# Patient Record
Sex: Male | Born: 1990 | Race: White | Hispanic: No | Marital: Single | Smoking: Current every day smoker
Health system: Southern US, Community
[De-identification: ages and names within clinical notes are randomized; demographics above are authoritative.]

## PROBLEM LIST (undated history)

## (undated) DIAGNOSIS — F32A Depression, unspecified: Secondary | ICD-10-CM

## (undated) DIAGNOSIS — F909 Attention-deficit hyperactivity disorder, unspecified type: Secondary | ICD-10-CM

## (undated) DIAGNOSIS — S82899A Other fracture of unspecified lower leg, initial encounter for closed fracture: Secondary | ICD-10-CM

## (undated) DIAGNOSIS — S129XXA Fracture of neck, unspecified, initial encounter: Secondary | ICD-10-CM

## (undated) DIAGNOSIS — F419 Anxiety disorder, unspecified: Secondary | ICD-10-CM

## (undated) DIAGNOSIS — F1210 Cannabis abuse, uncomplicated: Secondary | ICD-10-CM

## (undated) DIAGNOSIS — S0292XA Unspecified fracture of facial bones, initial encounter for closed fracture: Secondary | ICD-10-CM

## (undated) HISTORY — PX: SKULL FRACTURE ELEVATION: SHX781

## (undated) HISTORY — PX: BACK SURGERY: SHX140

---
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
## 2011-02-26 ENCOUNTER — Ambulatory Visit: Payer: Self-pay | Admitting: Family Medicine

## 2012-05-30 DIAGNOSIS — S0292XA Unspecified fracture of facial bones, initial encounter for closed fracture: Secondary | ICD-10-CM | POA: Insufficient documentation

## 2012-06-30 DIAGNOSIS — S129XXA Fracture of neck, unspecified, initial encounter: Secondary | ICD-10-CM | POA: Insufficient documentation

## 2013-01-30 ENCOUNTER — Emergency Department: Payer: Self-pay | Admitting: Emergency Medicine

## 2013-01-30 LAB — CBC WITH DIFFERENTIAL/PLATELET
Basophil %: 0.4 %
Lymphocyte %: 27.2 %
MCH: 31.2 pg (ref 26.0–34.0)
MCHC: 34.2 g/dL (ref 32.0–36.0)
MCV: 91 fL (ref 80–100)
Monocyte #: 1 x10 3/mm (ref 0.2–1.0)
Monocyte %: 9.5 %
Neutrophil %: 60.7 %
RBC: 4.68 10*6/uL (ref 4.40–5.90)

## 2013-01-30 LAB — COMPREHENSIVE METABOLIC PANEL
Albumin: 4.1 g/dL (ref 3.4–5.0)
Alkaline Phosphatase: 93 U/L (ref 50–136)
Anion Gap: 2 — ABNORMAL LOW (ref 7–16)
BUN: 8 mg/dL (ref 7–18)
Bilirubin,Total: 0.3 mg/dL (ref 0.2–1.0)
Calcium, Total: 9.5 mg/dL (ref 8.5–10.1)
Chloride: 105 mmol/L (ref 98–107)
Creatinine: 0.99 mg/dL (ref 0.60–1.30)
Glucose: 152 mg/dL — ABNORMAL HIGH (ref 65–99)
Osmolality: 279 (ref 275–301)
Potassium: 3.9 mmol/L (ref 3.5–5.1)
SGOT(AST): 14 U/L — ABNORMAL LOW (ref 15–37)
Sodium: 139 mmol/L (ref 136–145)
Total Protein: 7.8 g/dL (ref 6.4–8.2)

## 2013-01-30 LAB — URINALYSIS, COMPLETE
Bilirubin,UR: NEGATIVE
Blood: NEGATIVE
Glucose,UR: NEGATIVE mg/dL (ref 0–75)
Ph: 5 (ref 4.5–8.0)
Protein: 30
RBC,UR: 2 /HPF (ref 0–5)
Squamous Epithelial: 1
WBC UR: 2 /HPF (ref 0–5)

## 2013-02-23 ENCOUNTER — Emergency Department: Payer: Self-pay | Admitting: Emergency Medicine

## 2013-02-28 ENCOUNTER — Ambulatory Visit: Payer: Self-pay | Admitting: Pain Medicine

## 2013-03-10 ENCOUNTER — Emergency Department: Payer: Self-pay | Admitting: Emergency Medicine

## 2013-03-11 ENCOUNTER — Emergency Department: Payer: Self-pay | Admitting: Emergency Medicine

## 2013-03-11 LAB — COMPREHENSIVE METABOLIC PANEL
Albumin: 4.3 g/dL (ref 3.4–5.0)
Alkaline Phosphatase: 100 U/L (ref 50–136)
BUN: 7 mg/dL (ref 7–18)
Bilirubin,Total: 0.3 mg/dL (ref 0.2–1.0)
Calcium, Total: 9.5 mg/dL (ref 8.5–10.1)
EGFR (African American): >60
EGFR (Non-African Amer.): >60
Osmolality: 277 (ref 275–301)
Potassium: 4 mmol/L (ref 3.5–5.1)
SGOT(AST): 19 U/L (ref 15–37)
SGPT (ALT): 21 U/L (ref 12–78)
Sodium: 140 mmol/L (ref 136–145)
Total Protein: 7.9 g/dL (ref 6.4–8.2)

## 2013-03-11 LAB — CBC
MCH: 30.9 pg (ref 26.0–34.0)
MCV: 90 fL (ref 80–100)
Platelet: 340 10*3/uL (ref 150–440)
RDW: 13 % (ref 11.5–14.5)

## 2013-03-11 LAB — URINALYSIS, COMPLETE
RBC,UR: 3 /HPF (ref 0–5)
Squamous Epithelial: 1

## 2013-08-10 ENCOUNTER — Emergency Department: Payer: Self-pay | Admitting: Internal Medicine

## 2015-06-03 ENCOUNTER — Other Ambulatory Visit: Payer: Self-pay | Admitting: Thoracic Surgery

## 2015-06-03 ENCOUNTER — Ambulatory Visit
Admission: RE | Admit: 2015-06-03 | Discharge: 2015-06-03 | Disposition: A | Discharge status: Home / Self Care | Payer: Disability Insurance | Source: Ambulatory Visit | Attending: Thoracic Surgery | Admitting: Thoracic Surgery

## 2015-06-03 DIAGNOSIS — M549 Dorsalgia, unspecified: Secondary | ICD-10-CM | POA: Diagnosis present

## 2015-06-03 DIAGNOSIS — M542 Cervicalgia: Secondary | ICD-10-CM | POA: Insufficient documentation

## 2015-06-03 DIAGNOSIS — S22089A Unspecified fracture of T11-T12 vertebra, initial encounter for closed fracture: Secondary | ICD-10-CM | POA: Insufficient documentation

## 2015-06-03 DIAGNOSIS — M47814 Spondylosis without myelopathy or radiculopathy, thoracic region: Secondary | ICD-10-CM | POA: Insufficient documentation

## 2016-11-03 ENCOUNTER — Emergency Department: Payer: Self-pay

## 2016-11-03 ENCOUNTER — Encounter: Payer: Self-pay | Admitting: Medical Oncology

## 2016-11-03 ENCOUNTER — Emergency Department
Admission: EM | Admit: 2016-11-03 | Discharge: 2016-11-03 | Disposition: A | Discharge status: Home / Self Care | Payer: Self-pay | Attending: Emergency Medicine | Admitting: Emergency Medicine

## 2016-11-03 DIAGNOSIS — F17200 Nicotine dependence, unspecified, uncomplicated: Secondary | ICD-10-CM | POA: Insufficient documentation

## 2016-11-03 DIAGNOSIS — K047 Periapical abscess without sinus: Secondary | ICD-10-CM | POA: Insufficient documentation

## 2016-11-03 MED ORDER — CLINDAMYCIN HCL 300 MG PO CAPS: 300.0000 mg | ORAL_CAPSULE | Freq: Four times a day (QID) | ORAL | 0 refills | Status: AC

## 2016-11-03 MED ORDER — HYDROCODONE-ACETAMINOPHEN 5-325 MG PO TABS: 1.0000 | ORAL_TABLET | Freq: Four times a day (QID) | ORAL | 0 refills | Status: DC | PRN
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
## 2016-11-03 NOTE — Discharge Instructions (Signed)
OPTIONS FOR DENTAL FOLLOW UP CARE ° °[place] [facility] and Human Services - Local Safety Net Dental Clinics °http://www.ncdhhs.gov/dph/oralhealth/services/safetynetclinics.htm °  °[facility] (336-562-3123) ° °[facility] [place] (919-933-9087) ° °[facility] ([phone] ext [phone]) ° °[facility] ([phone]) ° °[facility] ([phone]) °This clinic caters to the indigent population and is on a lottery system. °Location: °[facility], [address], [place] °Clinic Hours: °Wednesdays from 6pm - 9pm, patients seen by a lottery system. °For dates, call or go to www.med.[facility].edu/[facility]/patients/Dental-[facility] °Services: °Cleanings, fillings and simple extractions. °Payment Options: °DENTAL WORK IS FREE OF CHARGE. Bring proof of income or support. °Best way to get seen: °Arrive at 5:15 pm - this is a lottery, NOT first come/first serve, so arriving earlier will not increase your chances of being seen. °  °  °[facility] °919-537-3737 °Select option 1 for emergencies °  °Location: °[facility], [address], [place] °Clinic Hours: °No walk-ins accepted - call the day before to schedule an appointment. °Check in times are 9:30 am and 1:30 pm. °Services: °Simple extractions, temporary fillings, pulpectomy/pulp debridement, uncomplicated abscess drainage. °Payment Options: °PAYMENT IS DUE AT THE TIME OF SERVICE.  Fee is usually $100-200, additional surgical procedures (e.g. abscess drainage) may be extra. °Cash, checks, Visa/MasterCard accepted.  Can file Medicaid if patient is covered for dental - patient should call case worker to check. °No discount for [facility] patients. °Best way to get seen: °MUST call the day before and get onto the schedule. Can usually be seen the next 1-2 days. No walk-ins accepted. °  °  °[facility] °[phone] °   °Location: °[facility], [address], [place] °Clinic Hours: °M, W, Th, F 8am or 1:30pm, Tues 9a or 1:30 - first come/first served. °Services: °Simple extractions, temporary fillings, uncomplicated abscess drainage.  You do not need to be an [place] resident. °Payment Options: °PAYMENT IS DUE AT THE TIME OF SERVICE. °Dental insurance, otherwise sliding scale - bring proof of income or support. °Depending on income and treatment needed, cost is usually $50-200. °Best way to get seen: °Arrive early as it is first come/first served. °  °  °[facility] °[phone] °  °Location: °[address] °Clinic Hours: °Mon-Thu 8a-5p °Services: °Most basic dental services including extractions and fillings. °Payment Options: °PAYMENT IS DUE AT THE TIME OF SERVICE. °Sliding scale, up to 50% off - bring proof if income or support. °Medicaid with dental option accepted. °Best way to get seen: °Call to schedule an appointment, can usually be seen within 2 weeks OR they will try to see walk-ins - show up at 8a or 2p (you may have to wait). °  °  °[facility] °919-245-2435 °[place] RESIDENTS ONLY °  °Location: °[facility], [address], [place], [place] [address] °Clinic Hours: By appointment only. °Monday - Thursday 8am-5pm, Friday 8am-12pm °Services: Cleanings, fillings, extractions. °Payment Options: °PAYMENT IS DUE AT THE TIME OF SERVICE. °Cash, Visa or MasterCard. Sliding scale - $30 minimum per service. °Best way to get seen: °Come in to office, complete packet and make an appointment - need proof of income °or support monies for each household member and proof of [place] residence. °Usually takes about a month to get in. °  °  °[facility] °[phone] °  °Location: °[address]   [place] °Clinic Hours: Walk-in Urgent Care Dental Services are offered Monday-Friday  mornings only. °The numbers of emergencies accepted daily is limited to the number of °providers available. °Maximum 15 - Mondays, Wednesdays & Thursdays °Maximum 10 - Tuesdays & Fridays °Services: °You do not need to be a  [place] resident to be seen for a dental emergency. °Emergencies are defined as pain, swelling, abnormal bleeding, or dental trauma. Walkins will receive x-rays if needed. °NOTE: Dental cleaning is not an emergency. °Payment Options: °PAYMENT IS DUE AT THE TIME OF SERVICE. °Minimum co-pay is $40.00 for uninsured patients. °Minimum co-pay is $3.00 for Medicaid with dental coverage. °Dental Insurance is accepted and must be presented at time of visit. °Medicare does not cover dental. °Forms of payment: Cash, credit card, checks. °Best way to get seen: °If not previously registered with the clinic, walk-in dental registration begins at 7:15 am and is on a first come/first serve basis. °If previously registered with the clinic, call to make an appointment. °  °  °[facility] °919-776-4359 °[place] RESIDENTS ONLY °  °Location: °[address], [place], [place] °Clinic Hours: °Mon-Thu 10a-2p °Services: Extractions only! °Payment Options: °FREE (donations accepted) - bring proof of income or support °Best way to get seen: °Call and schedule an appointment OR come at 8am on the 1st Monday of every month (except for holidays) when it is first come/first served. °  °  °[facility] °[phone] °  °Location: °[address], [place] °Clinic Hours: °Friday mornings °Services, Payment Options, Best way to get seen: °Call for info °

## 2016-11-03 NOTE — ED Triage Notes (Signed)
Lower dental pain.

## 2016-11-03 NOTE — ED Notes (Signed)
See triage note  States he developed pain to lower gumline several days ago  No fever or trauma

## 2017-02-21 ENCOUNTER — Encounter: Payer: Self-pay | Admitting: Emergency Medicine

## 2017-02-21 ENCOUNTER — Emergency Department
Admission: EM | Admit: 2017-02-21 | Discharge: 2017-02-21 | Disposition: A | Discharge status: Home / Self Care | Payer: Disability Insurance | Attending: Emergency Medicine | Admitting: Emergency Medicine

## 2017-02-21 DIAGNOSIS — F17210 Nicotine dependence, cigarettes, uncomplicated: Secondary | ICD-10-CM | POA: Insufficient documentation

## 2017-02-21 DIAGNOSIS — Y998 Other external cause status: Secondary | ICD-10-CM | POA: Insufficient documentation

## 2017-02-21 DIAGNOSIS — B86 Scabies: Secondary | ICD-10-CM | POA: Insufficient documentation

## 2017-02-21 DIAGNOSIS — W57XXXA Bitten or stung by nonvenomous insect and other nonvenomous arthropods, initial encounter: Secondary | ICD-10-CM | POA: Insufficient documentation

## 2017-02-21 DIAGNOSIS — S40862A Insect bite (nonvenomous) of left upper arm, initial encounter: Secondary | ICD-10-CM | POA: Insufficient documentation

## 2017-02-21 DIAGNOSIS — Y939 Activity, unspecified: Secondary | ICD-10-CM | POA: Insufficient documentation

## 2017-02-21 DIAGNOSIS — Y929 Unspecified place or not applicable: Secondary | ICD-10-CM | POA: Insufficient documentation

## 2017-02-21 HISTORY — DX: Attention-deficit hyperactivity disorder, unspecified type: F90.9

## 2017-02-21 MED ORDER — PERMETHRIN 1 % EX LOTN: TOPICAL_LOTION | CUTANEOUS | 1 refills | Status: AC

## 2017-02-21 NOTE — ED Provider Notes (Signed)
[facility] Emergency Department Provider Note  ____________________________________________  Time seen: Approximately 8:05 PM  I have reviewed the triage vital signs and the nursing notes.   HISTORY  Chief Complaint Insect Bite    HPI PROCTOR CARRIKER is a 26 y.o. male presenting to the emergency department with diffuse rash of the upper extremities with signs of excoriation. Burroughs visualized along bilateral forearms. Patient states that he has been recently staying in a trailer. His girlfriend has similar symptoms. Patient has been applying calamine lotion, which minimally relieved his symptoms. He denies dysphagia, chest tightness, chest pain, nausea, vomiting and abdominal pain.   Past Medical History:  Diagnosis Date  . ADHD     There are no active problems to display for this patient.   Past Surgical History:  Procedure Laterality Date  . BACK SURGERY    . SKULL FRACTURE ELEVATION      Prior to Admission medications   Medication Sig Start Date End Date Taking? Authorizing Provider  permethrin (PERMETHRIN LICE TREATMENT) 1 % lotion Apply to affected area once 02/21/17 02/20/18  Orvil Feil, PA-C    Allergies Penicillins  History reviewed. No pertinent family history.  Social History Social History  Substance Use Topics  . Smoking status: Current Every Day Smoker    Packs/day: 1.00    Types: Cigarettes  . Smokeless tobacco: Never Used  . Alcohol use No     Review of Systems  Constitutional: No fever/chills Eyes: No visual changes. No discharge ENT: No upper respiratory complaints. Cardiovascular: no chest pain. Respiratory: no cough. No SOB. Gastrointestinal: No abdominal pain.  No nausea, no vomiting.  No diarrhea.  No constipation. Musculoskeletal: Negative for musculoskeletal pain. Skin: Patient has rash Neurological: Negative for headaches, focal weakness or  numbness.   ____________________________________________   PHYSICAL EXAM:  VITAL SIGNS: ED Triage Vitals  Enc Vitals Group     BP 02/21/17 1919 130/69     Pulse Rate 02/21/17 1919 100     Resp 02/21/17 1919 18     Temp 02/21/17 1919 98.1 F (36.7 C)     Temp Source 02/21/17 1919 Oral     SpO2 02/21/17 1919 100 %     Weight 02/21/17 1915 175 lb (79.4 kg)     Height 02/21/17 1915 5\' 10"  (1.778 m)     Head Circumference --      Peak Flow --      Pain Score 02/21/17 1914 0     Pain Loc --      Pain Edu? --      Excl. in GC? --      Constitutional: Alert and oriented. Well appearing and in no acute distress. Eyes: Conjunctivae are normal. PERRL. EOMI. Head: Atraumatic.  Cardiovascular: Normal rate, regular rhythm. Normal S1 and S2.  Good peripheral circulation. Respiratory: Normal respiratory effort without tachypnea or retractions. Lungs CTAB. Good air entry to the bases with no decreased or absent breath sounds. Musculoskeletal: Full range of motion to all extremities. No gross deformities appreciated. Neurologic:  Normal speech and language. No gross focal neurologic deficits are appreciated.  Skin: Patient has diffuse rash of upper extremities with burrows and signs of excoriation. Psychiatric: Mood and affect are normal. Speech and behavior are normal. Patient exhibits appropriate insight and judgement.   ____________________________________________   LABS (all labs ordered are listed, but only abnormal results are displayed)  Labs Reviewed - No data to display ____________________________________________  EKG   ____________________________________________  RADIOLOGY  No results found.  ____________________________________________    PROCEDURES  Procedure(s) performed:    Procedures    Medications - No data to display   ____________________________________________   INITIAL IMPRESSION / ASSESSMENT AND PLAN / ED COURSE  Pertinent labs &  imaging results that were available during my care of the patient were reviewed by me and considered in my medical decision making (see chart for details).  Review of the [place] CSRS was performed in accordance of the [facility] prior to dispensing any controlled drugs.    Assessment and plan Scabies Patient's diagnosis is consistent with scabies. Patient will be discharged home with prescriptions for permethrin. Patient is to follow up with primary care as needed or otherwise directed. Patient is given ED precautions to return to the ED for any worsening or new symptoms. All patient questions were answered.      ____________________________________________  FINAL CLINICAL IMPRESSION(S) / ED DIAGNOSES  Final diagnoses:  Scabies      NEW MEDICATIONS STARTED DURING THIS VISIT:  New Prescriptions   PERMETHRIN (PERMETHRIN LICE TREATMENT) 1 % LOTION    Apply to affected area once        This chart was dictated using voice recognition software/Dragon. Despite best efforts to proofread, errors can occur which can change the meaning. Any change was purely unintentional.    Gasper LloydWoods, Ezariah Nace M, PA-C 02/21/17 2010    Sharyn CreamerQuale, Mark, MD 02/21/17 2043

## 2017-02-21 NOTE — ED Triage Notes (Signed)
Pt presents with scattered "insect bites" to body; girlfriend here with similar symptoms' pt says his had cleared up but returned; denies itching; pt says [place]; pt with noted slurred speech; admits to smoking marijuana this am
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
## 2017-11-29 ENCOUNTER — Other Ambulatory Visit: Payer: Self-pay

## 2017-11-29 ENCOUNTER — Emergency Department
Admission: EM | Admit: 2017-11-29 | Discharge: 2017-11-30 | Disposition: A | Discharge status: Home / Self Care | Payer: Disability Insurance | Attending: Emergency Medicine | Admitting: Emergency Medicine

## 2017-11-29 DIAGNOSIS — F322 Major depressive disorder, single episode, severe without psychotic features: Secondary | ICD-10-CM | POA: Insufficient documentation

## 2017-11-29 DIAGNOSIS — F320 Major depressive disorder, single episode, mild: Secondary | ICD-10-CM

## 2017-11-29 DIAGNOSIS — F17210 Nicotine dependence, cigarettes, uncomplicated: Secondary | ICD-10-CM | POA: Insufficient documentation

## 2017-11-29 DIAGNOSIS — R45851 Suicidal ideations: Secondary | ICD-10-CM | POA: Insufficient documentation

## 2017-11-29 LAB — ETHANOL

## 2017-11-29 LAB — COMPREHENSIVE METABOLIC PANEL
ALT: 27 U/L (ref 17–63)
ANION GAP: 6 (ref 5–15)
AST: 27 U/L (ref 15–41)
Albumin: 4.7 g/dL (ref 3.5–5.0)
Alkaline Phosphatase: 83 U/L (ref 38–126)
BILIRUBIN TOTAL: 0.3 mg/dL (ref 0.3–1.2)
BUN: 15 mg/dL (ref 6–20)
CO2: 30 mmol/L (ref 22–32)
Calcium: 9.7 mg/dL (ref 8.9–10.3)
Chloride: 101 mmol/L (ref 101–111)
Creatinine, Ser: 0.88 mg/dL (ref 0.61–1.24)
GFR calc Af Amer: >60 mL/min (ref >60)
Glucose, Bld: 70 mg/dL (ref 65–99)
POTASSIUM: 4.3 mmol/L (ref 3.5–5.1)
Sodium: 137 mmol/L (ref 135–145)
TOTAL PROTEIN: 8.3 g/dL — AB (ref 6.5–8.1)

## 2017-11-29 LAB — CBC
HEMATOCRIT: 42 % (ref 40.0–52.0)
Hemoglobin: 14.2 g/dL (ref 13.0–18.0)
MCH: 30.6 pg (ref 26.0–34.0)
MCHC: 33.7 g/dL (ref 32.0–36.0)
MCV: 90.8 fL (ref 80.0–100.0)
Platelets: 418 10*3/uL (ref 150–440)
RBC: 4.62 MIL/uL (ref 4.40–5.90)
RDW: 13.3 % (ref 11.5–14.5)
WBC: 19.9 10*3/uL — ABNORMAL HIGH (ref 3.8–10.6)

## 2017-11-29 NOTE — ED Provider Notes (Signed)
[facility] Emergency Department Provider Note  ____________________________________________  Time seen: Approximately 11:19 PM  I have reviewed the triage vital signs and the nursing notes.   HISTORY  Chief Complaint Suicidal   HPI Kyle Lucero is a 27 y.o. male with a history of depression ADHD who presents voluntarily for suicidal ideation.  Patient reports that his best friend committed suicide a few days ago by hanging himself.  Since then patient has been very depressed and having suicidal thoughts.  He does not have a plan.  He just wishes he was not around anymore.  He denies ever trying to kill himself in the past.  He has never been hospitalized in psychiatric institution.  Patient reports that he has not been on any medications for 18 months since being released from jail.  He does not have a psychiatrist.  He denies any medical complaints.   Chief Complaint: Depression Severity: Severe Duration: Several days Context: In the setting of recent loss of his best friend Associated signs/symptoms: SI with no plan  Past Medical History:  Diagnosis Date  . ADHD     Past Surgical History:  Procedure Laterality Date  . BACK SURGERY    . SKULL FRACTURE ELEVATION      Prior to Admission medications   Medication Sig Start Date End Date Taking? Authorizing Provider  permethrin (PERMETHRIN LICE TREATMENT) 1 % lotion Apply to affected area once Patient not taking: Reported on 11/29/2017 02/21/17 02/20/18  Orvil Feil, PA-C    Allergies Penicillins  No family history on file.  Social History Social History   Tobacco Use  . Smoking status: Current Every Day Smoker    Packs/day: 1.00    Types: Cigarettes  . Smokeless tobacco: Never Used  Substance Use Topics  . Alcohol use: No  . Drug use: Yes    Types: Marijuana    Comment: last smoked this am "probably"    Review of Systems  Constitutional: Negative for fever. Eyes: Negative for  visual changes. ENT: Negative for sore throat. Neck: No neck pain  Cardiovascular: Negative for chest pain. Respiratory: Negative for shortness of breath. Gastrointestinal: Negative for abdominal pain, vomiting or diarrhea. Genitourinary: Negative for dysuria. Musculoskeletal: Negative for back pain. Skin: Negative for rash. Neurological: Negative for headaches, weakness or numbness. Psych: + Depression and SI . No HI  ____________________________________________   PHYSICAL EXAM:  VITAL SIGNS: ED Triage Vitals  Enc Vitals Group     BP 11/29/17 2230 139/78     Pulse Rate 11/29/17 2230 (!) 117     Resp 11/29/17 2230 20     Temp 11/29/17 2230 98.2 F (36.8 C)     Temp Source 11/29/17 2230 Oral     SpO2 11/29/17 2230 100 %     Weight 11/29/17 2231 175 lb (79.4 kg)     Height 11/29/17 2231 6' (1.829 m)     Head Circumference --      Peak Flow --      Pain Score 11/29/17 2231 0     Pain Loc --      Pain Edu? --      Excl. in GC? --     Constitutional: Alert and oriented. Well appearing and in no apparent distress. HEENT:      Head: Normocephalic and atraumatic.         Eyes: Conjunctivae are normal. Sclera is non-icteric.       Mouth/Throat: Mucous membranes are moist.  Neck: Supple with no signs of meningismus. Cardiovascular: Regular rate and rhythm. No murmurs, gallops, or rubs. 2+ symmetrical distal pulses are present in all extremities. No JVD. Respiratory: Normal respiratory effort. Lungs are clear to auscultation bilaterally. No wheezes, crackles, or rhonchi.  Gastrointestinal: Soft, non tender, and non distended with positive bowel sounds. No rebound or guarding. Genitourinary: No CVA tenderness. Musculoskeletal: Nontender with normal range of motion in all extremities. No edema, cyanosis, or erythema of extremities. Neurologic: Normal speech and language. Face is symmetric. Moving all extremities. No gross focal neurologic deficits are appreciated. Skin: Skin  is warm, dry and intact. No rash noted. Psychiatric: Mood and affect are normal. Speech and behavior are normal.  ____________________________________________   LABS (all labs ordered are listed, but only abnormal results are displayed)  Labs Reviewed  CBC - Abnormal; Notable for the following components:      Result Value   WBC 19.9 (*)    All other components within normal limits  COMPREHENSIVE METABOLIC PANEL - Abnormal; Notable for the following components:   Total Protein 8.3 (*)    All other components within normal limits  ETHANOL  URINALYSIS, COMPLETE (UACMP) WITH MICROSCOPIC  URINE DRUG SCREEN, QUALITATIVE ([facility] ONLY)   ____________________________________________  EKG  none  ____________________________________________  RADIOLOGY  none  ____________________________________________   PROCEDURES  Procedure(s) performed: None Procedures Critical Care performed:  None ____________________________________________   INITIAL IMPRESSION / ASSESSMENT AND PLAN / ED COURSE   27 y.o. male with a history of depression ADHD who presents voluntarily for suicidal ideation.  Patient has no plan, no prior history of suicide attempts.  He has been very depressed after losing his best friend a few days ago who killed himself.  Patient is here voluntarily requesting help.  Will consult psychiatry. Meds for medical clearance pending. Care transferred to Dr. Zenda Alpers    As part of my medical decision making, I reviewed the following data within the electronic MEDICAL RECORD NUMBER Nursing notes reviewed and incorporated, Old chart reviewed, A consult was requested and obtained from this/these consultant(s) Psychiatry, Notes from prior ED visits and [place] Controlled Substance Database    Pertinent labs & imaging results that were available during my care of the patient were reviewed by me and considered in my medical decision making (see chart for  details).    ____________________________________________   FINAL CLINICAL IMPRESSION(S) / ED DIAGNOSES  Final diagnoses:  Current severe episode of major depressive disorder without psychotic features, unspecified whether recurrent (HCC)  Suicidal ideation      NEW MEDICATIONS STARTED DURING THIS VISIT:  ED Discharge Orders    None       Note:  This document was prepared using Dragon voice recognition software and may include unintentional dictation errors.    Nita Sickle, MD 11/29/17 [phone]

## 2017-11-29 NOTE — ED Triage Notes (Signed)
Pt states he has been depressed and having suicidal thoughts since friend killed himself recently.

## 2017-11-29 NOTE — ED Notes (Signed)
Pt. Transferred to Quad  from Triage to room [facility] after screening for contraband. Pt. Oriented to unit including Q15 minute rounds as well as Research scientist (medical). Patient is alert and oriented, warm and dry in no acute distress. Patient denies SI, HI, and AVH. Pt. Encouraged to let me know if needs arise.

## 2017-11-30 LAB — URINALYSIS, COMPLETE (UACMP) WITH MICROSCOPIC
BILIRUBIN URINE: NEGATIVE
Glucose, UA: NEGATIVE mg/dL
HGB URINE DIPSTICK: NEGATIVE
KETONES UR: NEGATIVE mg/dL
Leukocytes, UA: NEGATIVE
Nitrite: NEGATIVE
Protein, ur: NEGATIVE mg/dL
Specific Gravity, Urine: 1.005 (ref 1.005–1.030)
Squamous Epithelial / LPF: NONE SEEN (ref 0–5)
pH: 7 (ref 5.0–8.0)

## 2017-11-30 LAB — URINE DRUG SCREEN, QUALITATIVE (ARMC ONLY)
Amphetamines, Ur Screen: POSITIVE — AB
BARBITURATES, UR SCREEN: NOT DETECTED
BENZODIAZEPINE, UR SCRN: NOT DETECTED
CANNABINOID 50 NG, UR ~~LOC~~: POSITIVE — AB
Cocaine Metabolite,Ur ~~LOC~~: NOT DETECTED
MDMA (Ecstasy)Ur Screen: NOT DETECTED
Methadone Scn, Ur: NOT DETECTED
Opiate, Ur Screen: NOT DETECTED
Phencyclidine (PCP) Ur S: NOT DETECTED
TRICYCLIC, UR SCREEN: NOT DETECTED

## 2017-11-30 MED ORDER — NICOTINE 21 MG/24HR TD PT24
21.0000 mg | MEDICATED_PATCH | Freq: Once | TRANSDERMAL | Status: DC
  Administered 2017-11-30: 21 mg via TRANSDERMAL
  Filled 2017-11-30: qty 1

## 2017-11-30 NOTE — ED Notes (Signed)
Pt. Talking to SOC Psychiatrist. 

## 2017-11-30 NOTE — ED Notes (Signed)
Hourly rounding reveals patient sleeping in room. No complaints, stable, in no acute distress. Q15 minute rounds and monitoring via Rover and Officer to continue.  

## 2017-11-30 NOTE — ED Notes (Signed)
Pt given all his belongings.  

## 2017-11-30 NOTE — ED Notes (Signed)
Pt denies SI/HI and states that he wants to go home - Dr Darnelle Catalan notified and states that he will review the pt chart

## 2017-11-30 NOTE — ED Notes (Signed)
Pt provided breakfast tray.

## 2017-11-30 NOTE — ED Notes (Signed)
Hourly rounding reveals patient in room talking to TTS. No complaints, stable, in no acute distress. Q15 minute rounds and monitoring via Security Cameras to continue. 

## 2017-11-30 NOTE — ED Notes (Signed)
Hourly rounding reveals patient sleeping in room. No complaints, stable, in no acute distress. Q15 minute rounds and monitoring via Security Cameras to continue. 

## 2017-11-30 NOTE — ED Provider Notes (Signed)
-----------------------------------------   7:21 AM on 11/30/2017 -----------------------------------------   Blood pressure 139/78, pulse (!) 117, temperature 98.2 F (36.8 C), temperature source Oral, resp. rate 20, height 6' (1.829 m), weight 79.4 kg (175 lb), SpO2 100 %.  The patient had no acute events since last update.  Calm and cooperative at this time.  Disposition is pending Psychiatry/Behavioral Medicine team recommendations. Will watch the patient see what his pulse rate doses this set of vital signs is actually from his admission.    Arnaldo Natal, MD 11/30/17 [phone]

## 2017-11-30 NOTE — BH Assessment (Signed)
Assessment Note  Kyle Lucero is an 27 y.o. male. Mr. Sens arrive to the ED by way of personal transportation by his brother in law.  He reports that he was "having crazy thoughts in my head".  He states that he was thinking of hurting himself and others.  He reports that his friend "hung himself a couple days ago".  He denied having a plan for how he would harm himself or others. He reports symptoms of depression since the death of his friend.  He states that he can not sleep. He has ruminating thoughts of his death.  He reports that he gets very upset when his friend is mentioned.  He denied having auditory or visual hallucinations.  He denied additional stressors.  He reports that he uses marijuana.  He reports that he is was released from prison in 2016 and has not had any medication since before he was incarcerated.    Diagnosis: De0pression  Past Medical History:  Past Medical History:  Diagnosis Date  . ADHD     Past Surgical History:  Procedure Laterality Date  . BACK SURGERY    . SKULL FRACTURE ELEVATION      Family History: No family history on file.  Social History:  reports that he has been smoking cigarettes.  He has been smoking about 1.00 pack per day. He has never used smokeless tobacco. He reports that he has current or past drug history. Drug: Marijuana. He reports that he does not drink alcohol.  Additional Social History:  Alcohol / Drug Use History of alcohol / drug use?: Yes Substance #1 Name of Substance 1: Marijuana 1 - Age of First Use: 12 1 - Amount (size/oz): 1-2 blunts 1 - Frequency: 3-4 days a week 1 - Last Use / Amount: 11/29/2017  CIWA: CIWA-Ar BP: 139/78 Pulse Rate: (!) 117 COWS:    Allergies:  Allergies  Allergen Reactions  . Penicillins     Home Medications:  (Not in a hospital admission)  OB/GYN Status:  No LMP for male patient.  General Assessment Data Location of Assessment: [facility] ED TTS Assessment: In system Is this a Tele or  Face-to-Face Assessment?: Face-to-Face Is this an Initial Assessment or a Re-assessment for this encounter?: Initial Assessment Marital status: Single Maiden name: n/a Is patient pregnant?: No Pregnancy Status: No Living Arrangements: Parent Can pt return to current living arrangement?: Yes Admission Status: Voluntary Is patient capable of signing voluntary admission?: Yes Referral Source: Self/Family/Friend Insurance type: None  Medical Screening Exam [facility] ONLY) Medical Exam completed: Yes  Crisis Care Plan Living Arrangements: Parent Legal Guardian: Other:(Self) Name of Psychiatrist: None Name of Therapist: None  Education Status Is patient currently in school?: No Is the patient employed, unemployed or receiving disability?: Unemployed  Risk to self with the past 6 months Suicidal Ideation: Yes-Currently Present Has patient been a risk to self within the past 6 months prior to admission? : No Suicidal Intent: No Has patient had any suicidal intent within the past 6 months prior to admission? : No Is patient at risk for suicide?: No Suicidal Plan?: No Has patient had any suicidal plan within the past 6 months prior to admission? : No Access to Means: No What has been your use of drugs/alcohol within the last 12 months?: use of marijuana Previous Attempts/Gestures: No How many times?: 0 Other Self Harm Risks: denied Triggers for Past Attempts: None known Intentional Self Injurious Behavior: None Family Suicide History: No Recent stressful life event(s):  Loss (Comment)(recent death of friend to suicide) Persecutory voices/beliefs?: No Depression: Yes Depression Symptoms: Despondent Substance abuse history and/or treatment for substance abuse?: Yes Suicide prevention information given to non-admitted patients: Not applicable  Risk to Others within the past 6 months Homicidal Ideation: No Does patient have any lifetime risk of violence toward others beyond the  six months prior to admission? : No Thoughts of Harm to Others: No Current Homicidal Intent: No Current Homicidal Plan: No Access to Homicidal Means: No Identified Victim: None identified, denies at this time he wants to hurt anyone History of harm to others?: No Assessment of Violence: None Noted Violent Behavior Description: denied Does patient have access to weapons?: No Criminal Charges Pending?: No Does patient have a court date: No Is patient on probation?: No  Psychosis Hallucinations: None noted Delusions: None noted  Mental Status Report Appearance/Hygiene: In scrubs Eye Contact: Fair Motor Activity: Unremarkable Speech: Logical/coherent Level of Consciousness: Alert Mood: Sad Affect: Appropriate to circumstance Anxiety Level: None Thought Processes: Coherent Judgement: Partial Orientation: Appropriate for developmental age Obsessive Compulsive Thoughts/Behaviors: None  Cognitive Functioning Concentration: Poor Memory: Recent Intact Is patient IDD: No Is patient DD?: No Insight: Fair Impulse Control: Fair Appetite: Poor Have you had any weight changes? : No Change Sleep: Decreased Vegetative Symptoms: None  ADLScreening [facility]) Patient's cognitive ability adequate to safely complete daily activities?: Yes Patient able to express need for assistance with ADLs?: Yes Independently performs ADLs?: Yes (appropriate for developmental age)  Prior Inpatient Therapy Prior Inpatient Therapy: Yes Prior Therapy Dates: 2006 Prior Therapy Facilty/Provider(s): [facility] Reason for Treatment: Bipolar, Schizophrenia, ADHD  Prior Outpatient Therapy Prior Outpatient Therapy: Yes Prior Therapy Dates: 2006 Prior Therapy Facilty/Provider(s): [facility] Reason for Treatment: Bipolar, Schizophrenia, ADHD Does patient have an ACCT team?: No Does patient have Intensive In-House Services?  : No Does patient have [facility] services? : No Does  patient have P4CC services?: No  ADL Screening (condition at time of admission) Patient's cognitive ability adequate to safely complete daily activities?: Yes Is the patient deaf or have difficulty hearing?: No Does the patient have difficulty seeing, even when wearing glasses/contacts?: No Does the patient have difficulty concentrating, remembering, or making decisions?: Yes Patient able to express need for assistance with ADLs?: Yes Does the patient have difficulty dressing or bathing?: No Independently performs ADLs?: Yes (appropriate for developmental age) Does the patient have difficulty walking or climbing stairs?: No Weakness of Legs: None Weakness of Arms/Hands: None  Home Assistive Devices/Equipment Home Assistive Devices/Equipment: None    Abuse/Neglect Assessment (Assessment to be complete while patient is alone) Abuse/Neglect Assessment Can Be Completed: (denied history of abuse)                Disposition:  Disposition Initial Assessment Completed for this Encounter: Yes  On Site Evaluation by:   Reviewed with Physician:    Justice Deeds 11/30/2017 1:09 AM

## 2017-11-30 NOTE — ED Provider Notes (Signed)
patient was seen by tele-psychiatry is clear for discharge she currently denies suicidal or homicidal ideation says he feels much better thought he just needed some sleep. I will give him referral follow-up to [facility].   Arnaldo Natal, MD 11/30/17 [phone]

## 2017-11-30 NOTE — Discharge Instructions (Addendum)
Please return here for any further problems. Return especially if you feel like you might hurt yourself again. Please follow-up with [facility] the contact information is on the other sheet of paper you have.

## 2017-12-29 ENCOUNTER — Emergency Department
Admission: EM | Admit: 2017-12-29 | Discharge: 2017-12-29 | Disposition: A | Discharge status: Home / Self Care | Payer: Self-pay | Attending: Student in an Organized Health Care Education/Training Program | Admitting: Student in an Organized Health Care Education/Training Program

## 2017-12-29 ENCOUNTER — Other Ambulatory Visit: Payer: Self-pay

## 2017-12-29 ENCOUNTER — Emergency Department: Payer: Self-pay

## 2017-12-29 DIAGNOSIS — R1084 Generalized abdominal pain: Secondary | ICD-10-CM | POA: Insufficient documentation

## 2017-12-29 DIAGNOSIS — F17210 Nicotine dependence, cigarettes, uncomplicated: Secondary | ICD-10-CM | POA: Insufficient documentation

## 2017-12-29 DIAGNOSIS — R197 Diarrhea, unspecified: Secondary | ICD-10-CM | POA: Insufficient documentation

## 2017-12-29 DIAGNOSIS — R112 Nausea with vomiting, unspecified: Secondary | ICD-10-CM | POA: Insufficient documentation

## 2017-12-29 LAB — CBC
HCT: 44.8 % (ref 40.0–52.0)
HEMOGLOBIN: 15 g/dL (ref 13.0–18.0)
MCH: 30.2 pg (ref 26.0–34.0)
MCHC: 33.5 g/dL (ref 32.0–36.0)
MCV: 90 fL (ref 80.0–100.0)
Platelets: 376 10*3/uL (ref 150–440)
RBC: 4.98 MIL/uL (ref 4.40–5.90)
RDW: 13.2 % (ref 11.5–14.5)
WBC: 22.7 10*3/uL — ABNORMAL HIGH (ref 3.8–10.6)

## 2017-12-29 LAB — COMPREHENSIVE METABOLIC PANEL
ALBUMIN: 4.5 g/dL (ref 3.5–5.0)
ALK PHOS: 79 U/L (ref 38–126)
ALT: 28 U/L (ref 17–63)
AST: 29 U/L (ref 15–41)
Anion gap: 8 (ref 5–15)
BUN: 12 mg/dL (ref 6–20)
CALCIUM: 9.4 mg/dL (ref 8.9–10.3)
CO2: 24 mmol/L (ref 22–32)
CREATININE: 0.63 mg/dL (ref 0.61–1.24)
Chloride: 106 mmol/L (ref 101–111)
GFR calc Af Amer: >60 mL/min (ref >60)
GFR calc non Af Amer: >60 mL/min (ref >60)
GLUCOSE: 103 mg/dL — AB (ref 65–99)
Potassium: 4.1 mmol/L (ref 3.5–5.1)
Sodium: 138 mmol/L (ref 135–145)
Total Bilirubin: 0.5 mg/dL (ref 0.3–1.2)
Total Protein: 8.2 g/dL — ABNORMAL HIGH (ref 6.5–8.1)

## 2017-12-29 LAB — URINE DRUG SCREEN, QUALITATIVE (ARMC ONLY)
AMPHETAMINES, UR SCREEN: NOT DETECTED
Benzodiazepine, Ur Scrn: NOT DETECTED
CANNABINOID 50 NG, UR ~~LOC~~: POSITIVE — AB
COCAINE METABOLITE, UR ~~LOC~~: NOT DETECTED
MDMA (ECSTASY) UR SCREEN: NOT DETECTED
METHADONE SCREEN, URINE: NOT DETECTED
Opiate, Ur Screen: NOT DETECTED
Phencyclidine (PCP) Ur S: NOT DETECTED
TRICYCLIC, UR SCREEN: NOT DETECTED

## 2017-12-29 LAB — URINALYSIS, COMPLETE (UACMP) WITH MICROSCOPIC
Bacteria, UA: NONE SEEN
Bilirubin Urine: NEGATIVE
Glucose, UA: NEGATIVE mg/dL
Hgb urine dipstick: NEGATIVE
Ketones, ur: NEGATIVE mg/dL
Leukocytes, UA: NEGATIVE
Nitrite: NEGATIVE
Protein, ur: NEGATIVE mg/dL
SPECIFIC GRAVITY, URINE: 1.017 (ref 1.005–1.030)
pH: 7 (ref 5.0–8.0)

## 2017-12-29 LAB — LIPASE, BLOOD: Lipase: 27 U/L (ref 11–51)

## 2017-12-29 MED ORDER — RANITIDINE HCL 150 MG PO TABS: 150.0000 mg | ORAL_TABLET | Freq: Two times a day (BID) | ORAL | 1 refills | Status: DC

## 2017-12-29 MED ORDER — ONDANSETRON HCL 4 MG PO TABS: 4.0000 mg | ORAL_TABLET | Freq: Every day | ORAL | 0 refills | Status: DC | PRN

## 2017-12-29 MED ORDER — PROMETHAZINE HCL 12.5 MG PO TABS: 12.5000 mg | ORAL_TABLET | Freq: Four times a day (QID) | ORAL | 0 refills | Status: DC | PRN

## 2017-12-29 MED ORDER — SODIUM CHLORIDE 0.9 % IV BOLUS
1000.0000 mL | Freq: Once | INTRAVENOUS | Status: AC
  Administered 2017-12-29: 1000 mL via INTRAVENOUS

## 2017-12-29 MED ORDER — IOPAMIDOL (ISOVUE-300) INJECTION 61%
100.0000 mL | Freq: Once | INTRAVENOUS | Status: DC | PRN
  Filled 2017-12-29: qty 100

## 2017-12-29 MED ORDER — IOPAMIDOL (ISOVUE-370) INJECTION 76%
100.0000 mL | Freq: Once | INTRAVENOUS | Status: AC | PRN
  Administered 2017-12-29: 75 mL via INTRAVENOUS
  Filled 2017-12-29: qty 100

## 2017-12-29 MED ORDER — TRAMADOL HCL 50 MG PO TABS: 50.0000 mg | ORAL_TABLET | Freq: Four times a day (QID) | ORAL | 0 refills | Status: DC | PRN

## 2017-12-29 MED ORDER — PROMETHAZINE HCL 25 MG/ML IJ SOLN
12.5000 mg | Freq: Four times a day (QID) | INTRAMUSCULAR | Status: DC | PRN
  Administered 2017-12-29: 12.5 mg via INTRAVENOUS
  Filled 2017-12-29: qty 1

## 2017-12-29 MED ORDER — MORPHINE SULFATE (PF) 4 MG/ML IV SOLN
4.0000 mg | INTRAVENOUS | Status: DC | PRN
  Administered 2017-12-29: 4 mg via INTRAVENOUS
  Filled 2017-12-29: qty 1

## 2017-12-29 NOTE — Discharge Instructions (Signed)

## 2017-12-29 NOTE — ED Notes (Signed)
Pt given water at this time. Instructed to take sip and let us know if he becomes nauseated again.

## 2017-12-29 NOTE — ED Triage Notes (Addendum)
Left sided abdominal pain that he awoke with NVD. Belching that taste "like boiled eggs". Possible food poisoning per family. Pt alert and oriented X4, active, cooperative, pt in NAD. RR even and unlabored, color WNL.

## 2017-12-29 NOTE — ED Provider Notes (Signed)
[facility] Emergency Department Provider Note    First MD Initiated Contact with Patient 12/29/17 1356     (approximate)  I have reviewed the triage vital signs and the nursing notes.   HISTORY  Chief Complaint Abdominal Pain    HPI KINCAID TIGER is a 27 y.o. male of ADHD as well as recent evaluation in our psychiatric holding area for suicidal ideation presents the ER with nausea vomiting and diarrhea for the past 3 days as well as right lower quadrant abdominal pain.  States that his belching has been very foul-smelling, "like boiled eggs ".  Family found him vomiting nonbloody nonbilious emesis today at home.  Patient still having mild to moderate discomfort and presented to the ER.  Is never had symptoms like this before.  Denies any history of abdominal surgeries.  No recent antibiotics.    Past Medical History:  Diagnosis Date  . ADHD    No family history on file. Past Surgical History:  Procedure Laterality Date  . BACK SURGERY    . SKULL FRACTURE ELEVATION     There are no active problems to display for this patient.     Prior to Admission medications   Medication Sig Start Date End Date Taking? Authorizing Provider  permethrin (PERMETHRIN LICE TREATMENT) 1 % lotion Apply to affected area once Patient not taking: Reported on 11/29/2017 02/21/17 02/20/18  Orvil Feil, PA-C    Allergies Penicillins    Social History Social History   Tobacco Use  . Smoking status: Current Every Day Smoker    Packs/day: 1.00    Types: Cigarettes  . Smokeless tobacco: Never Used  Substance Use Topics  . Alcohol use: No  . Drug use: Yes    Types: Marijuana    Comment: last smoked this am "probably"    Review of Systems Patient denies headaches, rhinorrhea, blurry vision, numbness, shortness of breath, chest pain, edema, cough, abdominal pain, nausea, vomiting, diarrhea, dysuria, fevers, rashes or hallucinations unless otherwise stated above  in HPI. ____________________________________________   PHYSICAL EXAM:  VITAL SIGNS: Vitals:   12/29/17 1333  BP: 125/69  Pulse: 82  Resp: 16  Temp: 97.9 F (36.6 C)  SpO2: 100%    Constitutional: Alert and oriented.  Eyes: Conjunctivae are normal.  Head: Atraumatic. Nose: No congestion/rhinnorhea. Mouth/Throat: Mucous membranes are moist.   Neck: No stridor. Painless ROM.  Cardiovascular: Normal rate, regular rhythm. Grossly normal heart sounds.  Good peripheral circulation. Respiratory: Normal respiratory effort.  No retractions. Lungs CTAB. Gastrointestinal: Soft but with ttp in RLQ. No distention. No abdominal bruits. No CVA tenderness. Genitourinary: deferred Musculoskeletal: No lower extremity tenderness nor edema.  No joint effusions. Neurologic:  Normal speech and language. No gross focal neurologic deficits are appreciated. No facial droop Skin:  Skin is warm, dry and intact. Old healed linear superficial lacerations to left forearm. Psychiatric: Mood and affect are normal. Speech and behavior are normal.  ____________________________________________   LABS (all labs ordered are listed, but only abnormal results are displayed)  Results for orders placed or performed during the hospital encounter of 12/29/17 (from the past 24 hour(s))  Lipase, blood     Status: None   Collection Time: 12/29/17  1:40 PM  Result Value Ref Range   Lipase 27 11 - 51 U/L  Comprehensive metabolic panel     Status: Abnormal   Collection Time: 12/29/17  1:40 PM  Result Value Ref Range   Sodium 138 135 - 145  mmol/L   Potassium 4.1 3.5 - 5.1 mmol/L   Chloride 106 101 - 111 mmol/L   CO2 24 22 - 32 mmol/L   Glucose, Bld 103 (H) 65 - 99 mg/dL   BUN 12 6 - 20 mg/dL   Creatinine, Ser 4.78 0.61 - 1.24 mg/dL   Calcium 9.4 8.9 - 29.5 mg/dL   Total Protein 8.2 (H) 6.5 - 8.1 g/dL   Albumin 4.5 3.5 - 5.0 g/dL   AST 29 15 - 41 U/L   ALT 28 17 - 63 U/L   Alkaline Phosphatase 79 38 - 126 U/L     Total Bilirubin 0.5 0.3 - 1.2 mg/dL   GFR calc non Af Amer >60 >60 mL/min   GFR calc Af Amer >60 >60 mL/min   Anion gap 8 5 - 15  CBC     Status: Abnormal   Collection Time: 12/29/17  1:40 PM  Result Value Ref Range   WBC 22.7 (H) 3.8 - 10.6 K/uL   RBC 4.98 4.40 - 5.90 MIL/uL   Hemoglobin 15.0 13.0 - 18.0 g/dL   HCT 62.1 30.8 - 65.7 %   MCV 90.0 80.0 - 100.0 fL   MCH 30.2 26.0 - 34.0 pg   MCHC 33.5 32.0 - 36.0 g/dL   RDW 84.6 96.2 - 95.2 %   Platelets 376 150 - 440 K/uL   ____________________________________________ ____________________________________________  RADIOLOGY  I personally reviewed all radiographic images ordered to evaluate for the above acute complaints and reviewed radiology reports and findings.  These findings were personally discussed with the patient.  Please see medical record for radiology report.  ____________________________________________   PROCEDURES  Procedure(s) performed:  Procedures    Critical Care performed: no ____________________________________________   INITIAL IMPRESSION / ASSESSMENT AND PLAN / ED COURSE  Pertinent labs & imaging results that were available during my care of the patient were reviewed by me and considered in my medical decision making (see chart for details).   DDX: appendicitis, enteritic, ibd, sbo, food borne illness, electrolyte or metabolic abn  IZAIYAH KLEINMAN is a 27 y.o. who presents to the ED with symptoms as described above.  Patient afebrile nontoxic-appearing.  Blood work sent for the above differential does show evidence of significant leukocytosis.  Seems to have more generalized abdominal pain.  He does have family history of IBD.  Given the acuity of his symptoms with leukocytosis CT imaging will be ordered to further evaluate for the above differential.  Will provide IV fluids as well as IV pain med  and IV antiemetics.  Clinical Course as of Dec 30 1702  Wed Dec 29, 2017  1623 Repeat abdominal exam  without any right lower quadrant tenderness.  Now complaining of left-sided abdominal pain.  CT imaging shows no acute abnormality.  Suspect leukocytosis secondary to nausea vomiting and GI illness.  Symptoms at this time seem mild.  He is tolerating oral hydration.  At this point to be stable and appropriate for outpatient follow-up.   [PR]  1701 Patient tolerating oral hydration.  Remains hemodynamically stable.  Given his leukocytosis have instructed the patient to return to the ER in 12 to 24 hours for repeat abdominal exam his symptoms not improved.  Have discussed with the patient and available family all diagnostics and treatments performed thus far and all questions were answered to the best of my ability. The patient demonstrates understanding and agreement with plan.    [PR]    Clinical Course User Index [PR]  Willy Eddyobinson, Kaelie Henigan, MD     As part of my medical decision making, I reviewed the following data within the electronic MEDICAL RECORD NUMBER Nursing notes reviewed and incorporated, Labs reviewed, notes from prior ED visits.   ____________________________________________   FINAL CLINICAL IMPRESSION(S) / ED DIAGNOSES  Final diagnoses:  Generalized abdominal pain  Nausea vomiting and diarrhea      NEW MEDICATIONS STARTED DURING THIS VISIT:  New Prescriptions   No medications on file     Note:  This document was prepared using Dragon voice recognition software and may include unintentional dictation errors.    Willy Eddyobinson, Rahmel Nedved, MD 12/29/17 1705

## 2018-04-12 ENCOUNTER — Encounter: Payer: Self-pay | Admitting: Emergency Medicine

## 2018-04-12 ENCOUNTER — Emergency Department: Payer: Medicaid Other

## 2018-04-12 ENCOUNTER — Emergency Department
Admission: EM | Admit: 2018-04-12 | Discharge: 2018-04-12 | Disposition: A | Discharge status: Home / Self Care | Payer: Medicaid Other | Attending: Emergency Medicine | Admitting: Emergency Medicine

## 2018-04-12 DIAGNOSIS — Y929 Unspecified place or not applicable: Secondary | ICD-10-CM | POA: Insufficient documentation

## 2018-04-12 DIAGNOSIS — S93602A Unspecified sprain of left foot, initial encounter: Secondary | ICD-10-CM | POA: Diagnosis not present

## 2018-04-12 DIAGNOSIS — Y998 Other external cause status: Secondary | ICD-10-CM | POA: Diagnosis not present

## 2018-04-12 DIAGNOSIS — Y939 Activity, unspecified: Secondary | ICD-10-CM | POA: Insufficient documentation

## 2018-04-12 DIAGNOSIS — F17210 Nicotine dependence, cigarettes, uncomplicated: Secondary | ICD-10-CM | POA: Insufficient documentation

## 2018-04-12 DIAGNOSIS — Z79899 Other long term (current) drug therapy: Secondary | ICD-10-CM | POA: Insufficient documentation

## 2018-04-12 DIAGNOSIS — S99922A Unspecified injury of left foot, initial encounter: Secondary | ICD-10-CM | POA: Diagnosis present

## 2018-04-12 DIAGNOSIS — W1842XA Slipping, tripping and stumbling without falling due to stepping into hole or opening, initial encounter: Secondary | ICD-10-CM | POA: Diagnosis not present

## 2018-04-12 MED ORDER — IBUPROFEN 600 MG PO TABS
600.0000 mg | ORAL_TABLET | Freq: Once | ORAL | Status: AC
  Administered 2018-04-12: 600 mg via ORAL
  Filled 2018-04-12: qty 1

## 2018-04-12 MED ORDER — TRAMADOL HCL 50 MG PO TABS: 50.0000 mg | ORAL_TABLET | Freq: Two times a day (BID) | ORAL | 0 refills | Status: DC | PRN

## 2018-04-12 MED ORDER — TRAMADOL HCL 50 MG PO TABS
50.0000 mg | ORAL_TABLET | Freq: Once | ORAL | Status: AC
  Administered 2018-04-12: 50 mg via ORAL
  Filled 2018-04-12: qty 1

## 2018-04-12 MED ORDER — IBUPROFEN 600 MG PO TABS: 600.0000 mg | ORAL_TABLET | Freq: Three times a day (TID) | ORAL | 0 refills | Status: DC | PRN

## 2018-04-12 NOTE — ED Triage Notes (Signed)
PT arrives with complaints of left ankle pain after stepping in a hole yesterday.

## 2018-04-12 NOTE — Discharge Instructions (Addendum)
Ambulate with support for 2 to 3 days as needed. °

## 2018-04-12 NOTE — ED Provider Notes (Signed)
[facility] Emergency Department Provider Note   ____________________________________________   First MD Initiated Contact with Patient 04/12/18 1737     (approximate)  I have reviewed the triage vital signs and the nursing notes.   HISTORY  Chief Complaint Ankle Injury    HPI Kyle Lucero is a 27 y.o. male patient complain of left ankle foot pain secondary stepped in hole yesterday.  Patient state pain increased with weightbearing.  Patient rates pain as a 9/10.  Patient described pain is "ache".  Patient stated mild relief with elevation and ice.  Past Medical History:  Diagnosis Date  . ADHD     There are no active problems to display for this patient.   Past Surgical History:  Procedure Laterality Date  . BACK SURGERY    . SKULL FRACTURE ELEVATION      Prior to Admission medications   Medication Sig Start Date End Date Taking? Authorizing Provider  ibuprofen (ADVIL,MOTRIN) 600 MG tablet Take 1 tablet (600 mg total) by mouth every 8 (eight) hours as needed. 04/12/18   Joni Reining, PA-C  ondansetron (ZOFRAN) 4 MG tablet Take 1 tablet (4 mg total) by mouth daily as needed for nausea or vomiting. 12/29/17 12/29/18  Willy Eddy, MD  promethazine (PHENERGAN) 12.5 MG tablet Take 1 tablet (12.5 mg total) by mouth every 6 (six) hours as needed for nausea or vomiting. 12/29/17   Willy Eddy, MD  ranitidine (ZANTAC) 150 MG tablet Take 1 tablet (150 mg total) by mouth 2 (two) times daily. 12/29/17 12/29/18  Willy Eddy, MD  ranitidine (ZANTAC) 150 MG tablet Take 1 tablet (150 mg total) by mouth 2 (two) times daily. 12/29/17 12/29/18  Willy Eddy, MD  traMADol (ULTRAM) 50 MG tablet Take 1 tablet (50 mg total) by mouth every 6 (six) hours as needed. 12/29/17 12/29/18  Willy Eddy, MD  traMADol (ULTRAM) 50 MG tablet Take 1 tablet (50 mg total) by mouth every 12 (twelve) hours as needed. 04/12/18   Joni Reining, PA-C     Allergies Penicillins  No family history on file.  Social History Social History   Tobacco Use  . Smoking status: Current Every Day Smoker    Packs/day: 1.00    Types: Cigarettes  . Smokeless tobacco: Never Used  Substance Use Topics  . Alcohol use: No  . Drug use: Yes    Types: Marijuana    Comment: last smoked this am "probably"    Review of Systems  Constitutional: No fever/chills Eyes: No visual changes. ENT: No sore throat. Cardiovascular: Denies chest pain. Respiratory: Denies shortness of breath. Gastrointestinal: No abdominal pain.  No nausea, no vomiting.  No diarrhea.  No constipation. Genitourinary: Negative for dysuria. Musculoskeletal: Left foot and ankle pain. Skin: Negative for rash. Neurological: Negative for headaches, focal weakness or numbness. Allergic/Immunilogical: Penicillin ____________________________________________   PHYSICAL EXAM:  VITAL SIGNS: ED Triage Vitals  Enc Vitals Group     BP 04/12/18 1728 (!) 142/77     Pulse Rate 04/12/18 1728 (!) 114     Resp 04/12/18 1728 18     Temp 04/12/18 1728 99 F (37.2 C)     Temp Source 04/12/18 1728 Oral     SpO2 04/12/18 1728 98 %     Weight 04/12/18 1727 180 lb (81.6 kg)     Height 04/12/18 1727 6' (1.829 m)     Head Circumference --      Peak Flow --  Pain Score 04/12/18 1727 9     Pain Loc --      Pain Edu? --      Excl. in GC? --    Constitutional: Alert and oriented. Well appearing and in no acute distress. Cardiovascular: Normal rate, regular rhythm. Grossly normal heart sounds.  Good peripheral circulation. Respiratory: Normal respiratory effort.  No retractions. Lungs CTAB. Musculoskeletal: No obvious deformity to the right foot/ ankle.  Mild edema dorsal aspect of left foot.. Neurologic:  Normal speech and language. No gross focal neurologic deficits are appreciated. No gait instability. Skin:  Skin is warm, dry and intact. No rash noted. Psychiatric: Mood and affect  are normal. Speech and behavior are normal.  ____________________________________________   LABS (all labs ordered are listed, but only abnormal results are displayed)  Labs Reviewed - No data to display ____________________________________________  EKG   ____________________________________________  RADIOLOGY  ED MD interpretation:    Official radiology report(s): Dg Foot Complete Left  Result Date: 04/12/2018 CLINICAL DATA:  Pain and swelling left ankle.  Stepped in a hole. EXAM: LEFT FOOT - COMPLETE 3+ VIEW COMPARISON:  None. FINDINGS: There is no evidence of fracture or dislocation. There is no evidence of arthropathy or other focal bone abnormality. Soft tissues are unremarkable. IMPRESSION: Negative. Electronically Signed   By: Elberta Fortis M.D.   On: 04/12/2018 18:30    ____________________________________________   PROCEDURES  Procedure(s) performed: None  Procedures  Critical Care performed: No  ____________________________________________   INITIAL IMPRESSION / ASSESSMENT AND PLAN / ED COURSE  As part of my medical decision making, I reviewed the following data within the electronic MEDICAL RECORD NUMBER    Left foot and ankle pain secondary to sprain.  Discussed x-ray findings with patient.  Patient foot with Ace wrap and given crutches for ambulation.  Patient given discharge care instruction advised take medication as directed.  Patient advised [facility] condition persist.      ____________________________________________   FINAL CLINICAL IMPRESSION(S) / ED DIAGNOSES  Final diagnoses:  Foot sprain, left, initial encounter     ED Discharge Orders         Ordered    traMADol (ULTRAM) 50 MG tablet  Every 12 hours PRN     04/12/18 1837    ibuprofen (ADVIL,MOTRIN) 600 MG tablet  Every 8 hours PRN     04/12/18 1837           Note:  This document was prepared using Dragon voice recognition software and may include  unintentional dictation errors.    Joni Reining, PA-C 04/12/18 1842    Sharman Cheek, MD 04/16/18 1350

## 2018-04-12 NOTE — ED Notes (Signed)
See triage note  Presents with pain and swelling to left ankle  States he stepped in a hole ..rolled his ankle  Good pulses

## 2018-06-07 ENCOUNTER — Other Ambulatory Visit: Payer: Self-pay

## 2018-06-07 ENCOUNTER — Emergency Department
Admission: EM | Admit: 2018-06-07 | Discharge: 2018-06-07 | Disposition: A | Discharge status: Home / Self Care | Payer: Medicaid Other | Attending: Emergency Medicine | Admitting: Emergency Medicine

## 2018-06-07 DIAGNOSIS — M545 Low back pain, unspecified: Secondary | ICD-10-CM

## 2018-06-07 DIAGNOSIS — F17210 Nicotine dependence, cigarettes, uncomplicated: Secondary | ICD-10-CM | POA: Insufficient documentation

## 2018-06-07 DIAGNOSIS — Z79899 Other long term (current) drug therapy: Secondary | ICD-10-CM | POA: Insufficient documentation

## 2018-06-07 DIAGNOSIS — M5489 Other dorsalgia: Secondary | ICD-10-CM | POA: Diagnosis present

## 2018-06-07 MED ORDER — MELOXICAM 15 MG PO TABS: 15.0000 mg | ORAL_TABLET | Freq: Every day | ORAL | 1 refills | Status: AC

## 2018-06-07 MED ORDER — METHOCARBAMOL 500 MG PO TABS: 500.0000 mg | ORAL_TABLET | Freq: Three times a day (TID) | ORAL | 0 refills | Status: AC | PRN

## 2018-06-07 MED ORDER — KETOROLAC TROMETHAMINE 30 MG/ML IJ SOLN
30.0000 mg | Freq: Once | INTRAMUSCULAR | Status: AC
  Administered 2018-06-07: 30 mg via INTRAMUSCULAR
  Filled 2018-06-07: qty 1

## 2018-06-07 NOTE — ED Provider Notes (Signed)
[facility] Emergency Department Provider Note  ____________________________________________  Time seen: Approximately 9:49 PM  I have reviewed the triage vital signs and the nursing notes.   HISTORY  Chief Complaint Back Pain    HPI Kyle Lucero is a 27 y.o. male presents to the emergency department with 6/10  bilateral low back pain with right lower extremity radiculopathy that has bothered him intermittently for the past month.  No associated dysuria, hematuria, increased urinary frequency, weakness, bowel or bladder incontinence or saddle anesthesia.  Patient denies recent falls or mechanisms of trauma.  Patient has been ambulating without difficulty. No alleviating measures have been attempted.    Past Medical History:  Diagnosis Date  . ADHD     There are no active problems to display for this patient.   Past Surgical History:  Procedure Laterality Date  . BACK SURGERY    . SKULL FRACTURE ELEVATION      Prior to Admission medications   Medication Sig Start Date End Date Taking? Authorizing Provider  ibuprofen (ADVIL,MOTRIN) 600 MG tablet Take 1 tablet (600 mg total) by mouth every 8 (eight) hours as needed. 04/12/18   Joni Reining, PA-C  meloxicam (MOBIC) 15 MG tablet Take 1 tablet (15 mg total) by mouth daily for 7 days. 06/07/18 06/14/18  Orvil Feil, PA-C  methocarbamol (ROBAXIN) 500 MG tablet Take 1 tablet (500 mg total) by mouth every 8 (eight) hours as needed for up to 5 days. 06/07/18 06/12/18  Orvil Feil, PA-C  ondansetron (ZOFRAN) 4 MG tablet Take 1 tablet (4 mg total) by mouth daily as needed for nausea or vomiting. 12/29/17 12/29/18  Willy Eddy, MD  promethazine (PHENERGAN) 12.5 MG tablet Take 1 tablet (12.5 mg total) by mouth every 6 (six) hours as needed for nausea or vomiting. 12/29/17   Willy Eddy, MD  ranitidine (ZANTAC) 150 MG tablet Take 1 tablet (150 mg total) by mouth 2 (two) times daily. 12/29/17 12/29/18   Willy Eddy, MD  ranitidine (ZANTAC) 150 MG tablet Take 1 tablet (150 mg total) by mouth 2 (two) times daily. 12/29/17 12/29/18  Willy Eddy, MD  traMADol (ULTRAM) 50 MG tablet Take 1 tablet (50 mg total) by mouth every 6 (six) hours as needed. 12/29/17 12/29/18  Willy Eddy, MD  traMADol (ULTRAM) 50 MG tablet Take 1 tablet (50 mg total) by mouth every 12 (twelve) hours as needed. 04/12/18   Joni Reining, PA-C    Allergies Penicillins  No family history on file.  Social History Social History   Tobacco Use  . Smoking status: Current Every Day Smoker    Packs/day: 1.00    Types: Cigarettes  . Smokeless tobacco: Never Used  Substance Use Topics  . Alcohol use: No  . Drug use: Yes    Types: Marijuana    Comment: last smoked this am "probably"     Review of Systems  Constitutional: No fever/chills Eyes: No visual changes. No discharge ENT: No upper respiratory complaints. Cardiovascular: no chest pain. Respiratory: no cough. No SOB. Gastrointestinal: No abdominal pain.  No nausea, no vomiting.  No diarrhea.  No constipation. Musculoskeletal: Patient has back pain.  Skin: Negative for rash, abrasions, lacerations, ecchymosis. Neurological: Negative for headaches, focal weakness or numbness.   ____________________________________________   PHYSICAL EXAM:  VITAL SIGNS: ED Triage Vitals  Enc Vitals Group     BP 06/07/18 1929 125/69     Pulse Rate 06/07/18 1929 (!) 108     Resp  06/07/18 1929 18     Temp 06/07/18 1929 98 F (36.7 C)     Temp Source 06/07/18 1929 Oral     SpO2 06/07/18 1929 98 %     Weight 06/07/18 1927 180 lb (81.6 kg)     Height 06/07/18 1927 6' (1.829 m)     Head Circumference --      Peak Flow --      Pain Score 06/07/18 1934 6     Pain Loc --      Pain Edu? --      Excl. in GC? --      Constitutional: Alert and oriented. Well appearing and in no acute distress. Eyes: Conjunctivae are normal. PERRL. EOMI. Head:  Atraumatic. Cardiovascular: Normal rate, regular rhythm. Normal S1 and S2.  Good peripheral circulation. Respiratory: Normal respiratory effort without tachypnea or retractions. Lungs CTAB. Good air entry to the bases with no decreased or absent breath sounds. Musculoskeletal: Full range of motion to all extremities. No gross deformities appreciated. Patient has paraspinal muscle tenderness along the lumbar spine. Positive straight leg raise test, right.  Neurologic:  Normal speech and language. No gross focal neurologic deficits are appreciated.  Skin:  Skin is warm, dry and intact. No rash noted. Psychiatric: Mood and affect are normal. Speech and behavior are normal. Patient exhibits appropriate insight and judgement.   ____________________________________________   LABS (all labs ordered are listed, but only abnormal results are displayed)  Labs Reviewed - No data to display ____________________________________________  EKG   ____________________________________________  RADIOLOGY   No results found.  ____________________________________________    PROCEDURES  Procedure(s) performed:    Procedures    Medications  ketorolac (TORADOL) 30 MG/ML injection 30 mg (30 mg Intramuscular Given 06/07/18 2053)     ____________________________________________   INITIAL IMPRESSION / ASSESSMENT AND PLAN / ED COURSE  Pertinent labs & imaging results that were available during my care of the patient were reviewed by me and considered in my medical decision making (see chart for details).  Review of the [place] CSRS was performed in accordance of the [facility] prior to dispensing any controlled drugs.      Assessment and Plan:  Low back pain Patient presents to the emergency department with low back pain with right lower extremity radiculopathy that has bothered patient intermittently for the past month.  Neurologic exam and overall physical exam was reassuring.  Patient was given  an injection of Toradol in the emergency department and he was discharged with meloxicam and Robaxin.  He was advised to follow-up with primary care as needed.  All patient questions were answered.    ____________________________________________  FINAL CLINICAL IMPRESSION(S) / ED DIAGNOSES  Final diagnoses:  Acute bilateral low back pain without sciatica      NEW MEDICATIONS STARTED DURING THIS VISIT:  ED Discharge Orders         Ordered    meloxicam (MOBIC) 15 MG tablet  Daily     06/07/18 2050    methocarbamol (ROBAXIN) 500 MG tablet  Every 8 hours PRN     06/07/18 2050              This chart was dictated using voice recognition software/Dragon. Despite best efforts to proofread, errors can occur which can change the meaning. Any change was purely unintentional.    Orvil FeilWoods, Suleima Ohlendorf M, PA-C 06/07/18 2209    Dionne BucySiadecki, Sebastian, MD 06/08/18 0004
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
## 2018-07-04 ENCOUNTER — Emergency Department: Payer: Medicaid Other

## 2018-07-04 ENCOUNTER — Emergency Department
Admission: EM | Admit: 2018-07-04 | Discharge: 2018-07-05 | Disposition: A | Discharge status: Home / Self Care | Payer: Medicaid Other | Attending: Emergency Medicine | Admitting: Emergency Medicine

## 2018-07-04 ENCOUNTER — Encounter: Payer: Self-pay | Admitting: Emergency Medicine

## 2018-07-04 ENCOUNTER — Other Ambulatory Visit: Payer: Self-pay

## 2018-07-04 DIAGNOSIS — F17210 Nicotine dependence, cigarettes, uncomplicated: Secondary | ICD-10-CM | POA: Insufficient documentation

## 2018-07-04 DIAGNOSIS — F1910 Other psychoactive substance abuse, uncomplicated: Secondary | ICD-10-CM | POA: Insufficient documentation

## 2018-07-04 DIAGNOSIS — R55 Syncope and collapse: Secondary | ICD-10-CM | POA: Diagnosis not present

## 2018-07-04 DIAGNOSIS — Z79899 Other long term (current) drug therapy: Secondary | ICD-10-CM | POA: Diagnosis not present

## 2018-07-04 DIAGNOSIS — R4182 Altered mental status, unspecified: Secondary | ICD-10-CM | POA: Diagnosis present

## 2018-07-04 LAB — CBC
HEMATOCRIT: 40.3 % (ref 39.0–52.0)
Hemoglobin: 13.3 g/dL (ref 13.0–17.0)
MCH: 29.9 pg (ref 26.0–34.0)
MCHC: 33 g/dL (ref 30.0–36.0)
MCV: 90.6 fL (ref 80.0–100.0)
NRBC: 0 % (ref 0.0–0.2)
Platelets: 298 10*3/uL (ref 150–400)
RBC: 4.45 MIL/uL (ref 4.22–5.81)
RDW: 12.3 % (ref 11.5–15.5)
WBC: 11.2 10*3/uL — AB (ref 4.0–10.5)

## 2018-07-04 LAB — COMPREHENSIVE METABOLIC PANEL
ALT: 25 U/L (ref 0–44)
AST: 20 U/L (ref 15–41)
Albumin: 4.2 g/dL (ref 3.5–5.0)
Alkaline Phosphatase: 67 U/L (ref 38–126)
Anion gap: 6 (ref 5–15)
BUN: 14 mg/dL (ref 6–20)
CHLORIDE: 105 mmol/L (ref 98–111)
CO2: 28 mmol/L (ref 22–32)
Calcium: 9.1 mg/dL (ref 8.9–10.3)
Creatinine, Ser: 0.87 mg/dL (ref 0.61–1.24)
GFR calc Af Amer: >60 mL/min (ref >60)
Glucose, Bld: 93 mg/dL (ref 70–99)
Potassium: 4 mmol/L (ref 3.5–5.1)
Sodium: 139 mmol/L (ref 135–145)
Total Bilirubin: 0.7 mg/dL (ref 0.3–1.2)
Total Protein: 7.2 g/dL (ref 6.5–8.1)

## 2018-07-04 LAB — ETHANOL

## 2018-07-04 NOTE — ED Notes (Signed)
Pt sleeping  nsr on monitor.  Iv in place.

## 2018-07-04 NOTE — ED Notes (Signed)
Pt drinking ginger ale    md in with pt and family
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
## 2018-07-04 NOTE — ED Notes (Signed)
Patient transported to CT 

## 2018-07-04 NOTE — ED Notes (Signed)
Pt sleeping   Family at bedside. 
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
## 2018-07-05 NOTE — ED Notes (Signed)
Pt alert and talking  Pt drank ginger ale.  No n/v/  Family with pt.    md aware
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
## 2018-07-05 NOTE — ED Provider Notes (Signed)
Now more awake and able to tolerate orals.  Has a sober ride home.  Discharged home in improved condition.   Merrily Brittleifenbark, Taeden Geller, MD 07/05/18 0030

## 2018-07-12 DIAGNOSIS — F419 Anxiety disorder, unspecified: Secondary | ICD-10-CM | POA: Insufficient documentation

## 2018-07-12 DIAGNOSIS — F32A Depression, unspecified: Secondary | ICD-10-CM | POA: Insufficient documentation

## 2018-08-23 ENCOUNTER — Encounter: Payer: Self-pay | Admitting: Emergency Medicine

## 2018-08-23 ENCOUNTER — Emergency Department
Admission: EM | Admit: 2018-08-23 | Discharge: 2018-08-23 | Disposition: A | Discharge status: Home / Self Care | Payer: Medicaid Other | Attending: Emergency Medicine | Admitting: Emergency Medicine

## 2018-08-23 ENCOUNTER — Other Ambulatory Visit: Payer: Self-pay

## 2018-08-23 DIAGNOSIS — R202 Paresthesia of skin: Secondary | ICD-10-CM | POA: Insufficient documentation

## 2018-08-23 DIAGNOSIS — F909 Attention-deficit hyperactivity disorder, unspecified type: Secondary | ICD-10-CM | POA: Insufficient documentation

## 2018-08-23 DIAGNOSIS — F17210 Nicotine dependence, cigarettes, uncomplicated: Secondary | ICD-10-CM | POA: Diagnosis not present

## 2018-08-23 MED ORDER — CYCLOBENZAPRINE HCL 5 MG PO TABS: 5.0000 mg | ORAL_TABLET | Freq: Every day | ORAL | 0 refills | Status: AC

## 2018-08-23 MED ORDER — CYCLOBENZAPRINE HCL 5 MG PO TABS: 5.0000 mg | ORAL_TABLET | Freq: Every day | ORAL | 0 refills | Status: DC

## 2018-08-23 NOTE — ED Triage Notes (Signed)
PT c/o RT hand tingling and numbness x3 weeks. Denies any injuries or other symptoms. Pt in NAD,VSS . Pt on phone while in triage.

## 2018-08-23 NOTE — ED Notes (Signed)
Reference triage note. Pt states "When I press on certain area in my back, the numbness and tingling goes away". Pt appears to be in NAD at this time. Pt resting comfortably in bed. Equal strong grips to bilateral extremities.
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
## 2018-08-23 NOTE — ED Notes (Signed)
Pt brought back from waiting room. Pt was holding cell phone in his left hand and scrolling through facebook with his right hand. Pt was holding cell phone in right hand when we arrived at room 42.

## 2018-08-23 NOTE — Discharge Instructions (Signed)
Your exam is normal. There is no strength changes to your grip. You should set up and follow-up with a primary provider for ongoing symptoms. Start Vitamin B complex to improve nerve function.

## 2018-08-25 ENCOUNTER — Telehealth: Payer: Self-pay

## 2018-08-25 NOTE — Telephone Encounter (Signed)
[facility] Night - Client Nonclinical Telephone Record [facility] Medical Call Center Client [facility] Night - Client Client Site [facility] - Night Contact Type Call Who Is Calling Patient / Member / Family / Caregiver Caller Name Lewis Stuller Caller Phone Number [phone] Patient Name Kyle Lucero Patient DOB 09/21/1989 Call Type Message Only Information Provided Reason for Call Request to Schedule Office Appointment Initial Comment Caller states they are needing to schedule an appointment. Additional Comment Call Closed By: Nevin Bloodgood Transaction Date/Time: 08/24/2018 5:03:44 PM (ET)

## 2018-08-25 NOTE — Telephone Encounter (Signed)
I called pt and he was not available but pts mom said pt recently in ED and would like to schedule new pt appt if takes medicaid. Robin at front office said pt was Kyle Lucero and [name] will send list of providers to pts mom.
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
## 2019-02-14 ENCOUNTER — Emergency Department
Admission: EM | Admit: 2019-02-14 | Discharge: 2019-02-14 | Discharge status: Against Medical Advice | Payer: Medicaid Other

## 2019-06-26 DIAGNOSIS — F419 Anxiety disorder, unspecified: Secondary | ICD-10-CM

## 2019-06-27 ENCOUNTER — Other Ambulatory Visit: Payer: Self-pay

## 2019-06-27 ENCOUNTER — Ambulatory Visit: Payer: Medicaid Other | Admitting: Physician Assistant

## 2019-06-27 DIAGNOSIS — A5401 Gonococcal cystitis and urethritis, unspecified: Secondary | ICD-10-CM | POA: Diagnosis not present

## 2019-06-27 DIAGNOSIS — Z113 Encounter for screening for infections with a predominantly sexual mode of transmission: Secondary | ICD-10-CM

## 2019-06-27 MED ORDER — GEMIFLOXACIN MESYLATE 320 MG PO TABS: 320.0000 mg | ORAL_TABLET | Freq: Once | ORAL | 0 refills | Status: AC

## 2019-06-27 MED ORDER — AZITHROMYCIN 500 MG PO TABS
2000.0000 mg | ORAL_TABLET | Freq: Once | ORAL | Status: AC
  Administered 2019-06-28: 10:00:00 2000 mg via ORAL

## 2019-06-28 ENCOUNTER — Ambulatory Visit: Payer: Medicaid Other | Admitting: Physician Assistant

## 2019-06-28 ENCOUNTER — Telehealth: Payer: Self-pay | Admitting: Family Medicine

## 2019-06-28 ENCOUNTER — Encounter: Payer: Self-pay | Admitting: Physician Assistant

## 2019-06-28 DIAGNOSIS — Z113 Encounter for screening for infections with a predominantly sexual mode of transmission: Secondary | ICD-10-CM

## 2019-06-28 DIAGNOSIS — A5401 Gonococcal cystitis and urethritis, unspecified: Secondary | ICD-10-CM | POA: Diagnosis not present

## 2019-06-28 LAB — GRAM STAIN

## 2019-06-28 MED ORDER — GENTAMICIN SULFATE 40 MG/ML IJ SOLN
240.0000 mg | Freq: Once | INTRAMUSCULAR | Status: AC
  Administered 2019-06-28: 240 mg via INTRAMUSCULAR
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
## 2019-06-28 NOTE — Telephone Encounter (Signed)
Patient into clinic 06/28/2019 for medication administration secondary to unable to find prescribed medication at local pharmacies. Hal Morales, RN

## 2019-06-28 NOTE — Telephone Encounter (Signed)
patient's mom called, she is having a hard time finding the prescription, she has check several pharmacy's and they stated they don't carry the medicine.

## 2019-06-29 ENCOUNTER — Encounter: Payer: Self-pay | Admitting: Physician Assistant

## 2019-06-29 NOTE — Progress Notes (Signed)
Patient was in clinic on 06/27/2019, and diagnosed with GC.  Gentamicin not available at that time so patient was given Rx for Gemifloxacin 320 mg and Azithromycin 2 g po DOT.  Patient not able to find a pharmacy that has the Gemifloxacin in stock so RTC today.  DON Inetta Fermo, RN was able to get Gentamicin for [place] to give to patient.  Patient RTC today for Gentamicin 240mg  IM today.  Gentamicin given in R and L upper outer GM today.  Patient observed for 20 min after injections and released without any problems.  Reviewed with patient that he is not to have sex for at least 7 days and until after his "rash" has completely healed.  Patient verbalized understanding of recommendations and agrees with plan.

## 2019-11-20 ENCOUNTER — Ambulatory Visit: Payer: Medicaid Other | Admitting: Physician Assistant

## 2019-11-20 ENCOUNTER — Other Ambulatory Visit: Payer: Self-pay

## 2019-11-20 ENCOUNTER — Encounter: Payer: Self-pay | Admitting: Family Medicine

## 2019-11-20 DIAGNOSIS — Z792 Long term (current) use of antibiotics: Secondary | ICD-10-CM

## 2019-11-20 DIAGNOSIS — Z113 Encounter for screening for infections with a predominantly sexual mode of transmission: Secondary | ICD-10-CM

## 2019-11-20 MED ORDER — GENTAMICIN SULFATE 40 MG/ML IJ SOLN
240.0000 mg | Freq: Once | INTRAMUSCULAR | Status: AC
  Administered 2019-11-20: 240 mg via INTRAMUSCULAR

## 2019-11-20 MED ORDER — AZITHROMYCIN 500 MG PO TABS
2000.0000 mg | ORAL_TABLET | Freq: Once | ORAL | Status: AC
  Administered 2019-11-20: 2000 mg via ORAL

## 2019-11-20 NOTE — Progress Notes (Signed)
Patient here for retreatment for Gonorrhea. Was treated in 06/2019, states needs retreatment today. Patient's partner is here today also.Burt Knack, RN

## 2019-11-20 NOTE — Progress Notes (Signed)
Patient into clinic requesting re-treatment for GC.  Per RN declines any screening or blood work today and requests re-treatment only.  Per chart review, patient with allergy to PCN so he should receive Gentamicin 240 mg IM and Azithromycin 2 g po DOT today.

## 2020-01-14 ENCOUNTER — Encounter: Payer: Self-pay | Admitting: Emergency Medicine

## 2020-01-14 ENCOUNTER — Emergency Department
Admission: EM | Admit: 2020-01-14 | Discharge: 2020-01-14 | Disposition: A | Discharge status: Home / Self Care | Payer: Medicaid Other | Attending: Emergency Medicine | Admitting: Emergency Medicine

## 2020-01-14 ENCOUNTER — Other Ambulatory Visit: Payer: Self-pay

## 2020-01-14 DIAGNOSIS — F17210 Nicotine dependence, cigarettes, uncomplicated: Secondary | ICD-10-CM | POA: Diagnosis not present

## 2020-01-14 DIAGNOSIS — Y999 Unspecified external cause status: Secondary | ICD-10-CM | POA: Insufficient documentation

## 2020-01-14 DIAGNOSIS — S0501XA Injury of conjunctiva and corneal abrasion without foreign body, right eye, initial encounter: Secondary | ICD-10-CM | POA: Insufficient documentation

## 2020-01-14 DIAGNOSIS — Y9389 Activity, other specified: Secondary | ICD-10-CM | POA: Insufficient documentation

## 2020-01-14 DIAGNOSIS — W548XXA Other contact with dog, initial encounter: Secondary | ICD-10-CM | POA: Insufficient documentation

## 2020-01-14 DIAGNOSIS — Y92009 Unspecified place in unspecified non-institutional (private) residence as the place of occurrence of the external cause: Secondary | ICD-10-CM | POA: Insufficient documentation

## 2020-01-14 DIAGNOSIS — S0591XA Unspecified injury of right eye and orbit, initial encounter: Secondary | ICD-10-CM | POA: Diagnosis present

## 2020-01-14 MED ORDER — ERYTHROMYCIN 5 MG/GM OP OINT
TOPICAL_OINTMENT | Freq: Once | OPHTHALMIC | Status: AC
  Administered 2020-01-14: 1 via OPHTHALMIC
  Filled 2020-01-14: qty 1

## 2020-01-14 MED ORDER — ERYTHROMYCIN 5 MG/GM OP OINT: 1.0000 "application " | TOPICAL_OINTMENT | Freq: Four times a day (QID) | OPHTHALMIC | 0 refills | Status: DC

## 2020-01-14 MED ORDER — TETRACAINE HCL 0.5 % OP SOLN
1.0000 [drp] | Freq: Once | OPHTHALMIC | Status: AC
  Administered 2020-01-14: 2 [drp] via OPHTHALMIC
  Filled 2020-01-14: qty 4

## 2020-01-14 MED ORDER — FLUORESCEIN SODIUM 1 MG OP STRP
1.0000 | ORAL_STRIP | Freq: Once | OPHTHALMIC | Status: AC
  Administered 2020-01-14: 1 via OPHTHALMIC
  Filled 2020-01-14: qty 1

## 2020-01-14 NOTE — Discharge Instructions (Addendum)
You were seen today after dog scratch to your right eye.  You have been diagnosed with a corneal abrasion.  We are giving you antibiotics to put in your right eye 4 times a day.  Please call ophthalmology and schedule a follow-up appointment for tomorrow.

## 2020-01-14 NOTE — ED Provider Notes (Addendum)
[facility] Emergency Department Provider Note ____________________________________________  Time seen: 1515  I have reviewed the triage vital signs and the nursing notes.  HISTORY  Chief Complaint  Eye Injury   HPI Kyle Lucero is a 29 y.o. male presents to the ER with c/o right eye pain, blurred vision and watering. He reports his dog scratched him in his eye, last night. He reports his dog is UTD on immunizations. He did not try to flush his eye prior to coming to the ER.  Past Medical History:  Diagnosis Date   ADHD     Patient Active Problem List   Diagnosis Date Noted   Anxiety 07/12/2018   Depression 07/12/2018    Past Surgical History:  Procedure Laterality Date   BACK SURGERY     SKULL FRACTURE ELEVATION      Prior to Admission medications   Medication Sig Start Date End Date Taking? Authorizing Provider  erythromycin ophthalmic ointment Place 1 application into the right eye 4 (four) times daily. 01/14/20   Lorre Munroe, NP    Allergies Penicillins  History reviewed. No pertinent family history.  Social History Social History   Tobacco Use   Smoking status: Current Every Day Smoker    Packs/day: 0.50    Years: 15.00    Pack years: 7.50    Types: Cigarettes   Smokeless tobacco: Never Used  Substance Use Topics   Alcohol use: No   Drug use: Yes    Types: Marijuana    Comment: last smoked this am "probably"    Review of Systems  Constitutional: Negative for fever, chills or body aches. Eyes: Positive for right eye pain, blurred vision, swelling and redness. Cardiovascular: Negative for chest pain or chest tightness. Respiratory: Negative for cough or shortness of breath. Neurological: Negative for headaches, focal weakness, tingling or numbness. ____________________________________________  PHYSICAL EXAM:  VITAL SIGNS: ED Triage Vitals  Enc Vitals Group     BP 01/14/20 1508 117/72     Pulse Rate  01/14/20 1507 78     Resp 01/14/20 1507 18     Temp 01/14/20 1507 98.5 F (36.9 C)     Temp Source 01/14/20 1507 Oral     SpO2 01/14/20 1507 100 %     Weight 01/14/20 1508 185 lb (83.9 kg)     Height 01/14/20 1508 5\' 9"  (1.753 m)     Head Circumference --      Peak Flow --      Pain Score 01/14/20 1508 8     Pain Loc --      Pain Edu? --      Excl. in GC? --     Constitutional: Alert and oriented. Appears uncomfortable but in no distress. Head: Normocephalic and atraumatic. Eyes: Periorbital edema noted of the right upper eyelid. Sclera injected. Fluorescein stain shows corneal abrasion overlying the pupil and central part of the iris. Cardiovascular: Normal rate, regular rhythm. Respiratory: Normal respiratory effort. No wheezes/rales/rhonchi. Neurologic:  Normal gait without ataxia. Normal speech and language. No gross focal neurologic deficits are appreciated. ____________________________________________  INITIAL IMPRESSION / ASSESSMENT AND PLAN / ED COURSE  Traumatic Corneal Abrasion, Right Eye:  Erythromycin eye ointment to right eye in ER RX for Erythromycin eye ointment Follow up with ophthalmology as an outpatient ____________________________________________  FINAL CLINICAL IMPRESSION(S) / ED DIAGNOSES  Final diagnoses:  Abrasion of right cornea, initial encounter      03/16/20, NP 01/14/20 1550  Lorre Munroe, NP 01/14/20 1846    Concha Se, MD 01/15/20 1059

## 2020-01-14 NOTE — ED Triage Notes (Signed)
Pt here for scratch to right eye. Pt reports his dog jumped playing and nail caught right eye yesterday. Having pain to eye and blurry vision. Reports hard to hold eyelid open.

## 2020-01-14 NOTE — ED Notes (Signed)
Pt visual acuity in rt eye 20/50, acuity in lt eye 20/15.

## 2020-07-01 ENCOUNTER — Emergency Department
Admission: EM | Admit: 2020-07-01 | Discharge: 2020-07-01 | Disposition: A | Discharge status: Home / Self Care | Payer: Medicaid Other

## 2022-05-08 ENCOUNTER — Telehealth: Payer: Self-pay | Admitting: Family Medicine

## 2022-05-08 NOTE — Telephone Encounter (Signed)
Tried to call and change  appt slot times per [facility]

## 2022-05-11 ENCOUNTER — Ambulatory Visit: Payer: Medicaid Other

## 2022-09-27 ENCOUNTER — Emergency Department
Admission: EM | Admit: 2022-09-27 | Discharge: 2022-09-27 | Discharge status: Against Medical Advice | Payer: Medicaid Other | Attending: Emergency Medicine | Admitting: Emergency Medicine

## 2022-09-27 ENCOUNTER — Other Ambulatory Visit: Payer: Self-pay

## 2022-09-27 ENCOUNTER — Emergency Department: Payer: Medicaid Other

## 2022-09-27 DIAGNOSIS — Z5321 Procedure and treatment not carried out due to patient leaving prior to being seen by health care provider: Secondary | ICD-10-CM | POA: Diagnosis not present

## 2022-09-27 DIAGNOSIS — M25572 Pain in left ankle and joints of left foot: Secondary | ICD-10-CM | POA: Diagnosis not present
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
## 2022-09-28 ENCOUNTER — Emergency Department: Payer: Medicaid Other

## 2022-09-28 ENCOUNTER — Other Ambulatory Visit: Payer: Self-pay

## 2022-09-28 ENCOUNTER — Emergency Department
Admission: EM | Admit: 2022-09-28 | Discharge: 2022-09-28 | Disposition: A | Discharge status: Home / Self Care | Payer: Medicaid Other | Attending: Emergency Medicine | Admitting: Emergency Medicine

## 2022-09-28 DIAGNOSIS — Y92410 Unspecified street and highway as the place of occurrence of the external cause: Secondary | ICD-10-CM | POA: Insufficient documentation

## 2022-09-28 DIAGNOSIS — S92332A Displaced fracture of third metatarsal bone, left foot, initial encounter for closed fracture: Secondary | ICD-10-CM | POA: Insufficient documentation

## 2022-09-28 DIAGNOSIS — S92322A Displaced fracture of second metatarsal bone, left foot, initial encounter for closed fracture: Secondary | ICD-10-CM | POA: Insufficient documentation

## 2022-09-28 DIAGNOSIS — M25572 Pain in left ankle and joints of left foot: Secondary | ICD-10-CM | POA: Diagnosis present

## 2022-09-28 MED ORDER — KETOROLAC TROMETHAMINE 30 MG/ML IJ SOLN
30.0000 mg | Freq: Once | INTRAMUSCULAR | Status: AC
  Administered 2022-09-28: 30 mg via INTRAMUSCULAR
  Filled 2022-09-28: qty 1

## 2022-09-28 MED ORDER — TRAMADOL HCL 50 MG PO TABS: 50.0000 mg | ORAL_TABLET | Freq: Four times a day (QID) | ORAL | 0 refills | Status: DC | PRN
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
## 2022-09-30 ENCOUNTER — Ambulatory Visit: Payer: Medicaid Other | Admitting: Podiatry

## 2022-09-30 ENCOUNTER — Encounter: Payer: Self-pay | Admitting: Podiatry

## 2022-09-30 DIAGNOSIS — S82892A Other fracture of left lower leg, initial encounter for closed fracture: Secondary | ICD-10-CM

## 2022-09-30 DIAGNOSIS — S92302A Fracture of unspecified metatarsal bone(s), left foot, initial encounter for closed fracture: Secondary | ICD-10-CM

## 2022-09-30 DIAGNOSIS — S92002A Unspecified fracture of left calcaneus, initial encounter for closed fracture: Secondary | ICD-10-CM
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
## 2022-10-06 ENCOUNTER — Encounter: Payer: Self-pay | Admitting: Podiatry

## 2022-10-08 ENCOUNTER — Telehealth: Payer: Self-pay | Admitting: *Deleted

## 2022-10-08 NOTE — Telephone Encounter (Signed)
Called [facility], spoke with Magda Paganini and she said they had tried several attempts and his insurance is (not activated). Called patient and spoke with wife and she will have husband to call his insurance on Monday. I faxed the updated insurance card to [place] as well.

## 2022-10-12 ENCOUNTER — Encounter: Payer: Self-pay | Admitting: Podiatry

## 2022-10-14 ENCOUNTER — Other Ambulatory Visit: Payer: Medicaid Other

## 2022-10-16 ENCOUNTER — Other Ambulatory Visit: Payer: Medicaid Other

## 2022-10-21 ENCOUNTER — Ambulatory Visit
Admission: RE | Admit: 2022-10-21 | Discharge: 2022-10-21 | Disposition: A | Discharge status: Home / Self Care | Payer: Medicaid Other | Source: Ambulatory Visit | Attending: Podiatry | Admitting: Podiatry

## 2022-10-21 DIAGNOSIS — S92302A Fracture of unspecified metatarsal bone(s), left foot, initial encounter for closed fracture: Secondary | ICD-10-CM

## 2022-10-21 DIAGNOSIS — S92002A Unspecified fracture of left calcaneus, initial encounter for closed fracture: Secondary | ICD-10-CM

## 2022-10-21 DIAGNOSIS — S82892A Other fracture of left lower leg, initial encounter for closed fracture: Secondary | ICD-10-CM

## 2022-10-26 ENCOUNTER — Encounter: Payer: Self-pay | Admitting: Podiatry

## 2022-10-26 ENCOUNTER — Ambulatory Visit (INDEPENDENT_AMBULATORY_CARE_PROVIDER_SITE_OTHER): Payer: Medicaid Other | Admitting: Podiatry

## 2022-10-26 ENCOUNTER — Ambulatory Visit
Admission: RE | Admit: 2022-10-26 | Discharge: 2022-10-26 | Disposition: A | Discharge status: Home / Self Care | Payer: Medicaid Other | Source: Ambulatory Visit | Attending: Podiatry | Admitting: Podiatry

## 2022-10-26 VITALS — BP 143/75 | HR 102

## 2022-10-26 DIAGNOSIS — S92312A Displaced fracture of first metatarsal bone, left foot, initial encounter for closed fracture: Secondary | ICD-10-CM

## 2022-10-26 DIAGNOSIS — S92002A Unspecified fracture of left calcaneus, initial encounter for closed fracture: Secondary | ICD-10-CM

## 2022-10-26 DIAGNOSIS — S82892A Other fracture of left lower leg, initial encounter for closed fracture: Secondary | ICD-10-CM

## 2022-10-26 DIAGNOSIS — S92302A Fracture of unspecified metatarsal bone(s), left foot, initial encounter for closed fracture: Secondary | ICD-10-CM
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
## 2022-10-29 ENCOUNTER — Encounter
Admission: RE | Admit: 2022-10-29 | Discharge: 2022-10-29 | Disposition: A | Discharge status: Home / Self Care | Payer: Medicaid Other | Source: Ambulatory Visit | Attending: Podiatry | Admitting: Podiatry

## 2022-10-29 HISTORY — DX: Unspecified fracture of facial bones, initial encounter for closed fracture: S02.92XA

## 2022-10-29 HISTORY — DX: Anxiety disorder, unspecified: F41.9

## 2022-10-29 HISTORY — DX: Cannabis abuse, uncomplicated: F12.10

## 2022-10-29 HISTORY — DX: Depression, unspecified: F32.A

## 2022-10-29 HISTORY — DX: Fracture of neck, unspecified, initial encounter: S12.9XXA

## 2022-10-29 HISTORY — DX: Other fracture of unspecified lower leg, initial encounter for closed fracture: S82.899A

## 2022-10-29 NOTE — Patient Instructions (Addendum)
Your procedure is scheduled on: Report to the Registration Desk on the 1st floor of the [facility]. To find out your arrival time, please call [phone] between 1PM - 3PM on: If your arrival time is 6:00 am, do not arrive before that time as the [facility] entrance doors do not open until 6:00 am.  REMEMBER: Instructions that are not followed completely may result in serious medical risk, up to and including death; or upon the discretion of your surgeon and anesthesiologist your surgery may need to be rescheduled.  Do not eat food OR drink any liquids after midnight the night before surgery.  No gum chewing or hard candies  One week prior to surgery: Stop Anti-inflammatories (NSAIDS) such as Advil, Aleve, Ibuprofen, Motrin, Naproxen, Naprosyn and Aspirin based products such as Excedrin, Goody's Powder, BC Powder You may however, take Tylenol if needed for pain up until the day of surgery.  Do NOT take any more Aspirin-Last dose was 10-27-22  Do NOT take any medication the day of surgery  No Alcohol for 24 hours before or after surgery.  No Smoking including e-cigarettes for 24 hours before surgery.  No chewable tobacco products for at least 6 hours before surgery.  No nicotine patches on the day of surgery.  Do not use any "recreational" drugs for at least a week (preferably 2 weeks) before your surgery.  Please be advised that the combination of cocaine and anesthesia may have negative outcomes, up to and including death. If you test positive for cocaine, your surgery will be cancelled.  On the morning of surgery brush your teeth with toothpaste and water, you may rinse your mouth with mouthwash if you wish. Do not swallow any toothpaste or mouthwash.  Do not wear jewelry, make-up, hairpins, clips or nail polish.  Do not wear lotions, powders, or perfumes.   Do not shave body hair from the neck down 48 hours before surgery.  Contact lenses, hearing aids and dentures may  not be worn into surgery.  Do not bring valuables to the hospital. [facility] is not responsible for any missing/lost belongings or valuables.   Notify your doctor if there is any change in your medical condition (cold, fever, infection).  Wear comfortable clothing (specific to your surgery type) to the hospital.  After surgery, you can help prevent lung complications by doing breathing exercises.  Take deep breaths and cough every 1-2 hours. Your doctor may order a device called an Incentive Spirometer to help you take deep breaths. When coughing or sneezing, hold a pillow firmly against your incision with both hands. This is called "splinting." Doing this helps protect your incision. It also decreases belly discomfort.  If you are being admitted to the hospital overnight, leave your suitcase in the car. After surgery it may be brought to your room.  In case of increased patient census, it may be necessary for you, the patient, to continue your postoperative care in the Same Day Surgery department.  If you are being discharged the day of surgery, you will not be allowed to drive home. You will need a responsible individual to drive you home and stay with you for 24 hours after surgery.   If you are taking public transportation, you will need to have a responsible individual with you.  Please call the Pre-admissions Testing Dept. at [phone] if you have any questions about these instructions.  Surgery Visitation Policy:  Patients having surgery or a procedure may have two visitors.  Children under the age of 31 must have an adult with them who is not the patient.

## 2022-10-30 ENCOUNTER — Ambulatory Visit: Payer: Medicaid Other

## 2022-10-30 ENCOUNTER — Other Ambulatory Visit: Payer: Self-pay

## 2022-10-30 ENCOUNTER — Ambulatory Visit: Payer: Medicaid Other | Admitting: Anesthesiology

## 2022-10-30 ENCOUNTER — Ambulatory Visit
Admission: RE | Admit: 2022-10-30 | Discharge: 2022-10-30 | Disposition: A | Discharge status: Home / Self Care | Payer: Medicaid Other | Attending: Podiatry | Admitting: Podiatry

## 2022-10-30 ENCOUNTER — Encounter: Payer: Self-pay | Admitting: Podiatry

## 2022-10-30 ENCOUNTER — Encounter
Admission: RE | Disposition: A | Discharge status: Home / Self Care | Payer: Self-pay | Source: Home / Self Care | Attending: Podiatry

## 2022-10-30 DIAGNOSIS — S92192A Other fracture of left talus, initial encounter for closed fracture: Secondary | ICD-10-CM | POA: Diagnosis not present

## 2022-10-30 DIAGNOSIS — S92322A Displaced fracture of second metatarsal bone, left foot, initial encounter for closed fracture: Secondary | ICD-10-CM | POA: Insufficient documentation

## 2022-10-30 DIAGNOSIS — S92332A Displaced fracture of third metatarsal bone, left foot, initial encounter for closed fracture: Secondary | ICD-10-CM | POA: Insufficient documentation

## 2022-10-30 DIAGNOSIS — S92342A Displaced fracture of fourth metatarsal bone, left foot, initial encounter for closed fracture: Secondary | ICD-10-CM | POA: Diagnosis not present

## 2022-10-30 DIAGNOSIS — S8252XA Displaced fracture of medial malleolus of left tibia, initial encounter for closed fracture: Secondary | ICD-10-CM | POA: Insufficient documentation

## 2022-10-30 DIAGNOSIS — F17200 Nicotine dependence, unspecified, uncomplicated: Secondary | ICD-10-CM | POA: Insufficient documentation

## 2022-10-30 HISTORY — PX: ORIF ANKLE FRACTURE: SHX5408

## 2022-10-30 HISTORY — PX: ORIF TOE FRACTURE: SHX5032

## 2022-10-30 HISTORY — PX: FOOT ARTHRODESIS: SHX1655

## 2022-10-30 SURGERY — OPEN REDUCTION INTERNAL FIXATION (ORIF) METATARSAL (TOE) FRACTURE
Anesthesia: General | Site: Toe | Laterality: Left

## 2022-10-30 MED ORDER — KETOROLAC TROMETHAMINE 30 MG/ML IJ SOLN
INTRAMUSCULAR | Status: DC | PRN
  Administered 2022-10-30: 30 mg via INTRAVENOUS

## 2022-10-30 MED ORDER — ACD FORMULA A 0.73-2.45-2.2 GM/100ML VI SOLN
Status: AC | PRN
  Administered 2022-10-30: 30 mL

## 2022-10-30 MED ORDER — ASPIRIN 325 MG PO TBEC: 325.0000 mg | DELAYED_RELEASE_TABLET | Freq: Two times a day (BID) | ORAL | 0 refills | Status: AC

## 2022-10-30 MED ORDER — LIDOCAINE HCL (PF) 1 % IJ SOLN
INTRAMUSCULAR | Status: DC | PRN
  Administered 2022-10-30 (×2): 2.5 mL via SUBCUTANEOUS

## 2022-10-30 MED ORDER — PHENYLEPHRINE HCL (PRESSORS) 10 MG/ML IV SOLN: INTRAVENOUS | Status: DC | PRN

## 2022-10-30 MED ORDER — SODIUM CHLORIDE (PF) 0.9 % IJ SOLN
INTRAMUSCULAR | Status: DC | PRN
  Administered 2022-10-30: 5 mL

## 2022-10-30 MED ORDER — HEPARIN SODIUM (PORCINE) 1000 UNIT/ML IJ SOLN
INTRAMUSCULAR | Status: AC
  Filled 2022-10-30: qty 10

## 2022-10-30 MED ORDER — BUPIVACAINE HCL (PF) 0.5 % IJ SOLN
INTRAMUSCULAR | Status: DC | PRN
  Administered 2022-10-30 (×2): 10 mL via PERINEURAL

## 2022-10-30 MED ORDER — ACETAMINOPHEN 500 MG PO TABS: 1000.0000 mg | ORAL_TABLET | Freq: Four times a day (QID) | ORAL | 0 refills | Status: AC | PRN

## 2022-10-30 MED ORDER — EPHEDRINE SULFATE (PRESSORS) 50 MG/ML IJ SOLN: INTRAMUSCULAR | Status: DC | PRN

## 2022-10-30 MED ORDER — DEXAMETHASONE SODIUM PHOSPHATE 10 MG/ML IJ SOLN
INTRAMUSCULAR | Status: AC
  Filled 2022-10-30: qty 1

## 2022-10-30 MED ORDER — SODIUM CHLORIDE (PF) 0.9 % IJ SOLN
INTRAMUSCULAR | Status: AC
  Filled 2022-10-30: qty 10

## 2022-10-30 MED ORDER — PROPOFOL 500 MG/50ML IV EMUL
INTRAVENOUS | Status: DC | PRN
  Administered 2022-10-30: 30 mg via INTRAVENOUS
  Administered 2022-10-30: 20 mg via INTRAVENOUS
  Administered 2022-10-30: 100 ug/kg/min via INTRAVENOUS
  Administered 2022-10-30: 20 mg via INTRAVENOUS

## 2022-10-30 MED ORDER — LIDOCAINE HCL (PF) 2 % IJ SOLN
INTRAMUSCULAR | Status: AC
  Filled 2022-10-30: qty 5

## 2022-10-30 MED ORDER — DEXAMETHASONE SODIUM PHOSPHATE 10 MG/ML IJ SOLN
INTRAMUSCULAR | Status: DC | PRN
  Administered 2022-10-30: 10 mg via INTRAVENOUS

## 2022-10-30 MED ORDER — HEPARIN SODIUM (PORCINE) 1000 UNIT/ML IJ SOLN
INTRAMUSCULAR | Status: DC | PRN
  Administered 2022-10-30: 5000 [IU]

## 2022-10-30 MED ORDER — KETOROLAC TROMETHAMINE 30 MG/ML IJ SOLN
INTRAMUSCULAR | Status: AC
  Filled 2022-10-30: qty 1

## 2022-10-30 MED ORDER — FENTANYL CITRATE (PF) 100 MCG/2ML IJ SOLN
INTRAMUSCULAR | Status: DC | PRN
  Administered 2022-10-30 (×2): 50 ug via INTRAVENOUS

## 2022-10-30 MED ORDER — BUPIVACAINE LIPOSOME 1.3 % IJ SUSP
INTRAMUSCULAR | Status: DC | PRN
  Administered 2022-10-30 (×2): 10 mL via PERINEURAL

## 2022-10-30 MED ORDER — LACTATED RINGERS IV SOLN: INTRAVENOUS | Status: DC

## 2022-10-30 MED ORDER — DROPERIDOL 2.5 MG/ML IJ SOLN: 0.6250 mg | Freq: Once | INTRAMUSCULAR | Status: DC | PRN

## 2022-10-30 MED ORDER — BUPIVACAINE HCL (PF) 0.5 % IJ SOLN
INTRAMUSCULAR | Status: AC
  Filled 2022-10-30: qty 10

## 2022-10-30 MED ORDER — LIDOCAINE HCL (PF) 1 % IJ SOLN
INTRAMUSCULAR | Status: AC
  Filled 2022-10-30: qty 5

## 2022-10-30 MED ORDER — FAMOTIDINE 20 MG PO TABS
ORAL_TABLET | ORAL | Status: AC
  Filled 2022-10-30: qty 1

## 2022-10-30 MED ORDER — FENTANYL CITRATE (PF) 100 MCG/2ML IJ SOLN
INTRAMUSCULAR | Status: AC
  Filled 2022-10-30: qty 2

## 2022-10-30 MED ORDER — OXYCODONE HCL 5 MG PO TABS: 5.0000 mg | ORAL_TABLET | ORAL | 0 refills | Status: DC | PRN

## 2022-10-30 MED ORDER — DEXMEDETOMIDINE HCL IN NACL 200 MCG/50ML IV SOLN
INTRAVENOUS | Status: DC | PRN
  Administered 2022-10-30 (×2): 8 ug via INTRAVENOUS
  Administered 2022-10-30: 12 ug via INTRAVENOUS

## 2022-10-30 MED ORDER — PHENYLEPHRINE 80 MCG/ML (10ML) SYRINGE FOR IV PUSH (FOR BLOOD PRESSURE SUPPORT)
PREFILLED_SYRINGE | INTRAVENOUS | Status: DC | PRN
  Administered 2022-10-30 (×3): 80 ug via INTRAVENOUS
  Administered 2022-10-30: 240 ug via INTRAVENOUS
  Administered 2022-10-30 (×3): 80 ug via INTRAVENOUS

## 2022-10-30 MED ORDER — FENTANYL CITRATE (PF) 100 MCG/2ML IJ SOLN: 25.0000 ug | INTRAMUSCULAR | Status: DC | PRN

## 2022-10-30 MED ORDER — LIDOCAINE HCL (CARDIAC) PF 100 MG/5ML IV SOSY
PREFILLED_SYRINGE | INTRAVENOUS | Status: DC | PRN
  Administered 2022-10-30: 10 mg via INTRAVENOUS

## 2022-10-30 MED ORDER — EPHEDRINE SULFATE (PRESSORS) 50 MG/ML IJ SOLN
INTRAMUSCULAR | Status: DC | PRN
  Administered 2022-10-30 (×4): 25 mg via INTRAMUSCULAR

## 2022-10-30 MED ORDER — PHENYLEPHRINE 80 MCG/ML (10ML) SYRINGE FOR IV PUSH (FOR BLOOD PRESSURE SUPPORT)
PREFILLED_SYRINGE | INTRAVENOUS | Status: AC
  Filled 2022-10-30: qty 10

## 2022-10-30 MED ORDER — HEPARIN SODIUM (PORCINE) 10000 UNIT/ML IJ SOLN
INTRAMUSCULAR | Status: AC
  Filled 2022-10-30: qty 1

## 2022-10-30 MED ORDER — FENTANYL CITRATE PF 50 MCG/ML IJ SOSY
PREFILLED_SYRINGE | INTRAMUSCULAR | Status: AC
  Filled 2022-10-30: qty 1

## 2022-10-30 MED ORDER — CEFAZOLIN SODIUM-DEXTROSE 2-4 GM/100ML-% IV SOLN
2.0000 g | INTRAVENOUS | Status: AC
  Administered 2022-10-30: 2 g via INTRAVENOUS

## 2022-10-30 MED ORDER — BUPIVACAINE LIPOSOME 1.3 % IJ SUSP
INTRAMUSCULAR | Status: AC
  Filled 2022-10-30: qty 20

## 2022-10-30 MED ORDER — 0.9 % SODIUM CHLORIDE (POUR BTL) OPTIME
TOPICAL | Status: DC | PRN
  Administered 2022-10-30: 1000 mL

## 2022-10-30 MED ORDER — ORAL CARE MOUTH RINSE: 15.0000 mL | Freq: Once | OROMUCOSAL | Status: AC

## 2022-10-30 MED ORDER — SUCCINYLCHOLINE CHLORIDE 200 MG/10ML IV SOSY
PREFILLED_SYRINGE | INTRAVENOUS | Status: AC
  Filled 2022-10-30: qty 10

## 2022-10-30 MED ORDER — GABAPENTIN 300 MG PO CAPS: 300.0000 mg | ORAL_CAPSULE | Freq: Three times a day (TID) | ORAL | 0 refills | Status: AC

## 2022-10-30 MED ORDER — CEFAZOLIN SODIUM-DEXTROSE 2-4 GM/100ML-% IV SOLN
INTRAVENOUS | Status: AC
  Filled 2022-10-30: qty 100

## 2022-10-30 MED ORDER — SEVOFLURANE IN SOLN
RESPIRATORY_TRACT | Status: AC
  Filled 2022-10-30: qty 250

## 2022-10-30 MED ORDER — SUCCINYLCHOLINE 20MG/ML (10ML) SYRINGE FOR MEDFUSION PUMP - OPTIME
INTRAMUSCULAR | Status: DC | PRN
  Administered 2022-10-30: 100 mg via INTRAVENOUS

## 2022-10-30 MED ORDER — MIDAZOLAM HCL 2 MG/2ML IJ SOLN
1.0000 mg | Freq: Once | INTRAMUSCULAR | Status: AC
  Administered 2022-10-30: 1 mg via INTRAVENOUS

## 2022-10-30 MED ORDER — ONDANSETRON HCL 4 MG/2ML IJ SOLN
INTRAMUSCULAR | Status: DC | PRN
  Administered 2022-10-30: 4 mg via INTRAVENOUS

## 2022-10-30 MED ORDER — MIDAZOLAM HCL 2 MG/2ML IJ SOLN
INTRAMUSCULAR | Status: AC
  Filled 2022-10-30: qty 2

## 2022-10-30 MED ORDER — LIDOCAINE HCL (PF) 1 % IJ SOLN: INTRAMUSCULAR | Status: DC | PRN

## 2022-10-30 MED ORDER — FENTANYL CITRATE PF 50 MCG/ML IJ SOSY
50.0000 ug | PREFILLED_SYRINGE | Freq: Once | INTRAMUSCULAR | Status: AC
  Administered 2022-10-30: 50 ug via INTRAVENOUS

## 2022-10-30 MED ORDER — FAMOTIDINE 20 MG PO TABS
20.0000 mg | ORAL_TABLET | Freq: Once | ORAL | Status: AC
  Administered 2022-10-30: 20 mg via ORAL

## 2022-10-30 MED ORDER — ONDANSETRON HCL 4 MG/2ML IJ SOLN
INTRAMUSCULAR | Status: AC
  Filled 2022-10-30: qty 2

## 2022-10-30 MED ORDER — CHLORHEXIDINE GLUCONATE 0.12 % MT SOLN
15.0000 mL | Freq: Once | OROMUCOSAL | Status: AC
  Administered 2022-10-30: 15 mL via OROMUCOSAL

## 2022-10-30 MED ORDER — IBUPROFEN 600 MG PO TABS: 600.0000 mg | ORAL_TABLET | Freq: Four times a day (QID) | ORAL | 0 refills | Status: AC | PRN

## 2022-10-30 MED ORDER — PROPOFOL 1000 MG/100ML IV EMUL
INTRAVENOUS | Status: AC
  Filled 2022-10-30: qty 100

## 2022-10-30 MED ORDER — CHLORHEXIDINE GLUCONATE 0.12 % MT SOLN
OROMUCOSAL | Status: AC
  Filled 2022-10-30: qty 15

## 2022-10-30 SURGICAL SUPPLY — 118 items
2.4 X 22 LOCKING SCREW IMPLANT
2.4X 22 CORT SCREW IMPLANT
2.4X 24 LOCKING SCREW IMPLANT
3.0 X 20 CANN SCREW IMPLANT
3.5X 22 LOCKING SCREW IMPLANT
7 HOLE STRAIGHT PLATE IMPLANT
APL PRP STRL LF DISP 70% ISPRP (MISCELLANEOUS) ×6
BASIN KIT SINGLE STR (MISCELLANEOUS) IMPLANT
BIT DRILL 1.7 (BIT) IMPLANT
BIT DRILL 2 CANN SM BONE QF (BIT) IMPLANT
BIT DRILL 2.2 CANN STRGHT (BIT) IMPLANT
BIT DRILL 2.5 CANN STRL (BIT) IMPLANT
BIT DRILL 2.6 CANN (BIT) IMPLANT
BLADE OSC/SAGITTAL MD 5.5X18 (BLADE) IMPLANT
BLADE OSCILLATING/SAGITTAL (BLADE) ×3
BLADE SAW SAG MICRO THIN 65D (BLADE) ×3 IMPLANT
BLADE SURG 15 STRL LF DISP TIS (BLADE) ×6 IMPLANT
BLADE SURG 15 STRL SS (BLADE) ×6
BLADE SW THK.38XMED LNG THN (BLADE) IMPLANT
BNDG CMPR 5X4 CHSV STRCH STRL (GAUZE/BANDAGES/DRESSINGS) ×3
BNDG CMPR STD VLCR NS LF 5.8X4 (GAUZE/BANDAGES/DRESSINGS)
BNDG CMPR STD VLCR NS LF 5.8X6 (GAUZE/BANDAGES/DRESSINGS) ×9
BNDG COHESIVE 4X5 TAN STRL LF (GAUZE/BANDAGES/DRESSINGS) ×3 IMPLANT
BNDG ELASTIC 4X5.8 VLCR NS LF (GAUZE/BANDAGES/DRESSINGS) ×3 IMPLANT
BNDG ELASTIC 6X5.8 VLCR NS LF (GAUZE/BANDAGES/DRESSINGS) IMPLANT
BNDG ESMARCH 4 X 12 STRL LF (GAUZE/BANDAGES/DRESSINGS) ×3
BNDG ESMARCH 4X12 STRL LF (GAUZE/BANDAGES/DRESSINGS) ×3 IMPLANT
BNDG GZE 12X3 1 PLY HI ABS (GAUZE/BANDAGES/DRESSINGS)
BNDG STRETCH GAUZE 3IN X12FT (GAUZE/BANDAGES/DRESSINGS) ×3 IMPLANT
CHLORAPREP W/TINT 26 (MISCELLANEOUS) ×3 IMPLANT
COVER BACK TABLE 60X90IN (DRAPES) ×3 IMPLANT
CUFF TOURN SGL QUICK 18X4 (TOURNIQUET CUFF) ×3 IMPLANT
CUFF TOURN SGL QUICK 24 (TOURNIQUET CUFF)
CUFF TOURN SGL QUICK 30 (TOURNIQUET CUFF) ×3
CUFF TRNQT CYL 24X4X16.5-23 (TOURNIQUET CUFF) IMPLANT
CUFF TRNQT CYL 30X4X21-28X (TOURNIQUET CUFF) IMPLANT
DRAPE C-ARM 42X70 (DRAPES) ×3 IMPLANT
DRAPE C-ARMOR (DRAPES) ×3 IMPLANT
DRAPE FLUOR MINI C-ARM 54X84 (DRAPES) ×3 IMPLANT
DRSG MEPITEL 4X7.2 (GAUZE/BANDAGES/DRESSINGS) ×3 IMPLANT
ELECT REM PT RETURN 9FT ADLT (ELECTROSURGICAL) ×3
ELECTRODE REM PT RTRN 9FT ADLT (ELECTROSURGICAL) ×3 IMPLANT
GAUZE PAD ABD 8X10 STRL (GAUZE/BANDAGES/DRESSINGS) ×3 IMPLANT
GAUZE SPONGE 4X4 12PLY STRL (GAUZE/BANDAGES/DRESSINGS) ×3 IMPLANT
GAUZE XEROFORM 1X8 LF (GAUZE/BANDAGES/DRESSINGS) ×3 IMPLANT
GLOVE BIO SURGEON STRL SZ7.5 (GLOVE) ×3 IMPLANT
GLOVE BIOGEL M 7.0 STRL (GLOVE) ×3 IMPLANT
GLOVE BIOGEL PI IND STRL 7.5 (GLOVE) ×3 IMPLANT
GLOVE BIOGEL PI IND STRL 8 (GLOVE) ×3 IMPLANT
GLOVE PI ORTHO PRO STRL SZ7 (GLOVE) ×3 IMPLANT
GOWN STRL REUS W/ TWL LRG LVL3 (GOWN DISPOSABLE) ×3 IMPLANT
GOWN STRL REUS W/ TWL XL LVL3 (GOWN DISPOSABLE) ×3 IMPLANT
GOWN STRL REUS W/TWL LRG LVL3 (GOWN DISPOSABLE) ×3
GOWN STRL REUS W/TWL XL LVL3 (GOWN DISPOSABLE) ×3
GUIDEWIRE .045XTROC TIP LSR LN (WIRE) IMPLANT
GUIDEWIRE 1.35MM (WIRE) IMPLANT
IV CATH ANGIO 12GX3 LT BLUE (NEEDLE) IMPLANT
K-WIRE .045 CH (WIRE)
K-WIRE .062 (WIRE)
K-WIRE 1.1 (WIRE) ×3
K-WIRE BB-TAK (WIRE) ×12
K-WIRE FX6X.062X2 END TROC (WIRE)
KIT BONE MRW ASP ANGEL CPRP (KITS) IMPLANT
KIT TURNOVER KIT A (KITS) ×3 IMPLANT
KWIRE .045 CH (WIRE) IMPLANT
KWIRE BB-TAK (WIRE) IMPLANT
KWIRE FX6X.062X2 END TROC (WIRE) IMPLANT
LABEL OR SOLS (LABEL) ×3 IMPLANT
MANIFOLD NEPTUNE II (INSTRUMENTS) ×3 IMPLANT
NDL HYPO 22X1.5 SAFETY MO (MISCELLANEOUS) ×3 IMPLANT
NEEDLE HYPO 22X1.5 SAFETY MO (MISCELLANEOUS) ×3 IMPLANT
NS IRRIG 1000ML POUR BTL (IV SOLUTION) IMPLANT
PACK EXTREMITY ARMC (MISCELLANEOUS) ×3 IMPLANT
PAD ABD DERMACEA PRESS 5X9 (GAUZE/BANDAGES/DRESSINGS) ×3 IMPLANT
PAD CAST 4YDX4 CTTN HI CHSV (CAST SUPPLIES) ×3 IMPLANT
PAD PREP 24X41 OB/GYN DISP (PERSONAL CARE ITEMS) ×3 IMPLANT
PADDING CAST BLEND 6X4 STRL (MISCELLANEOUS) IMPLANT
PADDING CAST COTTON 4X4 STRL (CAST SUPPLIES)
PADDING CAST COTTON 6X4 NS (MISCELLANEOUS) IMPLANT
PADDING CAST COTTON 6X4 STRL (CAST SUPPLIES) ×3 IMPLANT
PENCIL SMOKE EVACUATOR (MISCELLANEOUS) IMPLANT
PLATE LONG TI 37.85 4H (Plate) IMPLANT
PUTTY DBM ALLOSYNC PURE 5CC (Putty) IMPLANT
SCREW CANN FT 3.5X44 (Screw) IMPLANT
SCREW CORT TI 3.5X26 (Screw) IMPLANT
SCREW KREULOCK LP 3.5X20 (Screw) IMPLANT
SCREW LP CORTEX 2.4X18 (Screw) IMPLANT
SCREW QCFIX CANN 4X50 SHT (Screw) IMPLANT
SCREW VAL TI 2.4X18 (Screw) IMPLANT
SCREW VAR TI 2.4X20 (Screw) IMPLANT
SLEEVE SCD COMPRESS KNEE MED (STOCKING) ×3 IMPLANT
SPLINT CAST 1 STEP 4X30 (MISCELLANEOUS) IMPLANT
SPONGE T-LAP 18X18 ~~LOC~~+RFID (SPONGE) ×3 IMPLANT
STAPLER SKIN PROX 35W (STAPLE) IMPLANT
STOCKINETTE M/LG 89821 (MISCELLANEOUS) ×3 IMPLANT
STOCKINETTE ORTHO 6X25 (MISCELLANEOUS) ×3 IMPLANT
STRAP SAFETY 5IN WIDE (MISCELLANEOUS) ×3 IMPLANT
SUCTION FRAZIER HANDLE 10FR (MISCELLANEOUS) ×3
SUCTION TUBE FRAZIER 10FR DISP (MISCELLANEOUS) ×3 IMPLANT
SUT DVC V-LOC 4-0 90 CLR P-12 (SUTURE)
SUT ETHILON 3 0 PS 1 (SUTURE) ×3 IMPLANT
SUT ETHILON 3-0 (SUTURE) IMPLANT
SUT ETHILON 4-0 (SUTURE)
SUT ETHILON 4-0 FS2 18XMFL BLK (SUTURE)
SUT MNCRL AB 3-0 PS2 27 (SUTURE) ×3 IMPLANT
SUT MNCRL AB 4-0 PS2 18 (SUTURE) ×3 IMPLANT
SUT MON AB 5-0 P3 18 (SUTURE) ×3 IMPLANT
SUT VIC AB 2-0 CT1 (SUTURE) IMPLANT
SUT VIC AB 2-0 SH 27 (SUTURE)
SUT VIC AB 2-0 SH 27XBRD (SUTURE) ×3 IMPLANT
SUT VICRYL 3-0 27IN (SUTURE) IMPLANT
SUTURE DVC V-LC4-0 90 CLR P-12 (SUTURE) IMPLANT
SUTURE ETHLN 4-0 FS2 18XMF BLK (SUTURE) IMPLANT
SYR 10ML LL (SYRINGE) ×3 IMPLANT
SYR CONTROL 10ML LL (SYRINGE) ×3 IMPLANT
TOWEL OR 17X26 4PK STRL BLUE (TOWEL DISPOSABLE) IMPLANT
TRAP FLUID SMOKE EVACUATOR (MISCELLANEOUS) ×3 IMPLANT
WATER STERILE IRR 500ML POUR (IV SOLUTION) ×3 IMPLANT

## 2022-10-30 NOTE — Anesthesia Procedure Notes (Signed)
Anesthesia Regional Block: Other (Saphenous)   Pre-Anesthetic Checklist: , timeout performed,  Correct Patient, Correct Site, Correct Laterality,  Correct Procedure, Correct Position, site marked,  Risks and benefits discussed,  Surgical consent,  Pre-op evaluation,  At surgeon's request and post-op pain management  Laterality: Lower and Left  Prep: chloraprep       Needles:  Injection technique: Single-shot  Needle Type: Echogenic Needle     Needle Length: 9cm  Needle Gauge: 21     Additional Needles:   Procedures:,,,, ultrasound used (permanent image in chart),,    Narrative:  Start time: 10/30/2022 11:30 AM End time: 10/30/2022 11:31 AM Injection made incrementally with aspirations every 5 mL.  Performed by: Personally  Anesthesiologist: Lenard Simmer, MD  Additional Notes: Patient consented for risk and benefits of nerve block including but not limited to nerve damage, failed block, bleeding and infection.  Patient voiced understanding.  Functioning IV was confirmed and monitors were applied.  Timeout done prior to procedure and prior to any sedation being given to the patient.  Patient confirmed procedure site prior to any sedation given to the patient.  A 50mm 22ga Stimuplex needle was used. Sterile prep,hand hygiene and sterile gloves were used.  Minimal sedation used for procedure.  No paresthesia endorsed by patient during the procedure.  Negative aspiration and negative test dose prior to incremental administration of local anesthetic. The patient tolerated the procedure well with no immediate complications.

## 2022-10-30 NOTE — Brief Op Note (Signed)
10/30/2022  4:21 PM  PATIENT:  SHADOW SCHEDLER  32 y.o. male  PRE-OPERATIVE DIAGNOSIS:  Closed nondisplaced fracture of metatarsal bone of left foot Closed fracture of left ankle  POST-OPERATIVE DIAGNOSIS:  Closed nondisplaced fracture of metatarsal bone of left foot Closed fracture of left ankle  PROCEDURE:  Procedure(s): OPEN REDUCTION INTERNAL FIXATION (ORIF) METATARSAL (TOE) FRACTURE (Left) ARTHRODESIS FOOT (Left) OPEN REDUCTION INTERNAL FIXATION (ORIF) ANKLE FRACTURE (Left)  SURGEON:  Surgeon(s) and Role:    * Bradrick Kamau, Rachelle Hora, DPM - Primary   ASSISTANTS: none   ANESTHESIA:   regional and general  EBL:     BLOOD ADMINISTERED:none  DRAINS: none   LOCAL MEDICATIONS USED:  NONE  SPECIMEN:  No Specimen  DISPOSITION OF SPECIMEN:  N/A  COUNTS:  YES  TOURNIQUET:   Total Tourniquet Time Documented: Thigh (Left) - 122 minutes Total: Thigh (Left) - 122 minutes   DICTATION: .Note written in EPIC  PLAN OF CARE: Discharge to home after PACU  PATIENT DISPOSITION:  PACU - hemodynamically stable.   Delay start of Pharmacological VTE agent (>24hrs) due to surgical blood loss or risk of bleeding: no

## 2022-10-30 NOTE — Anesthesia Procedure Notes (Signed)
Procedure Name: Intubation Date/Time: 10/30/2022 1:10 PM  Performed by: Lily Lovings, CRNAPre-anesthesia Checklist: Patient identified, Patient being monitored, Timeout performed, Emergency Drugs available and Suction available Patient Re-evaluated:Patient Re-evaluated prior to induction Oxygen Delivery Method: Circle system utilized Preoxygenation: Pre-oxygenation with 100% oxygen Induction Type: IV induction Ventilation: Mask ventilation without difficulty Laryngoscope Size: McGraph and 4 Grade View: Grade I Tube type: Oral Tube size: 7.0 mm Number of attempts: 1 Airway Equipment and Method: Stylet Placement Confirmation: ETT inserted through vocal cords under direct vision, positive ETCO2 and breath sounds checked- equal and bilateral Secured at: 21 cm Tube secured with: Tape Dental Injury: Teeth and Oropharynx as per pre-operative assessment

## 2022-10-30 NOTE — Anesthesia Postprocedure Evaluation (Signed)
Anesthesia Post Note  Patient: JOSHAWN CRISSMAN  Procedure(s) Performed: OPEN REDUCTION INTERNAL FIXATION (ORIF) METATARSAL (TOE) FRACTURE (Left: Toe) ARTHRODESIS FOOT (Left: Foot) OPEN REDUCTION INTERNAL FIXATION (ORIF) ANKLE FRACTURE (Left: Ankle)  Patient location during evaluation: PACU Anesthesia Type: General and Regional Level of consciousness: awake and alert Pain management: pain level controlled Vital Signs Assessment: post-procedure vital signs reviewed and stable Respiratory status: spontaneous breathing, nonlabored ventilation, respiratory function stable and patient connected to nasal cannula oxygen Cardiovascular status: blood pressure returned to baseline and stable Postop Assessment: no apparent nausea or vomiting Anesthetic complications: no   No notable events documented.   Last Vitals:  Vitals:   10/30/22 1630 10/30/22 1645  BP: 121/67 125/69  Pulse: 93 90  Resp: 17 15  Temp:  37.3 C  SpO2: 100% 99%    Last Pain:  Vitals:   10/30/22 1622  TempSrc:   PainSc: Asleep                 Louie Boston

## 2022-10-30 NOTE — Anesthesia Procedure Notes (Signed)
Procedure Name: LMA Insertion Date/Time: 10/30/2022 12:56 PM  Performed by: Lily Lovings, CRNAPre-anesthesia Checklist: Patient identified, Patient being monitored, Timeout performed, Emergency Drugs available and Suction available Patient Re-evaluated:Patient Re-evaluated prior to induction Oxygen Delivery Method: Circle system utilized Preoxygenation: Pre-oxygenation with 100% oxygen Induction Type: IV induction Ventilation: Mask ventilation without difficulty LMA: LMA inserted Tube type: Oral Number of attempts: 1 Placement Confirmation: positive ETCO2 and breath sounds checked- equal and bilateral Tube secured with: Tape Dental Injury: Teeth and Oropharynx as per pre-operative assessment

## 2022-10-30 NOTE — H&P (Signed)
History and Physical Interval Note:  10/30/2022 11:03 AM  Kyle Lucero  has presented today for surgery, with the diagnosis of left ankle fracture, multiple metatarsal fractures left foot.  The various methods of treatment have been discussed with the patient and family. After consideration of risks, benefits and other options for treatment, the patient has consented to   Procedure(s): OPEN REDUCTION INTERNAL FIXATION (ORIF) METATARSAL (TOE) FRACTURE (Left) ARTHRODESIS FOOT (Left) OPEN REDUCTION INTERNAL FIXATION (ORIF) ANKLE FRACTURE (Left) as a surgical intervention.  The patient's history has been reviewed, patient examined, no change in status, stable for surgery.  I have reviewed the patient's chart and labs.  Questions were answered to the patient's satisfaction.     Edwin Cap

## 2022-10-30 NOTE — Discharge Instructions (Addendum)
Post-Surgery Instructions  1. If you are recuperating from surgery anywhere other than home, please be sure to leave [place] a number where you can be reached. 2. Go directly home and rest. 3. The keep operated foot (or feet) elevated six inches above the hip when sitting or lying down. 4. Support the elevated foot and leg with pillows under the calf. DO NOT PLACE PILLOWS UNDER THE KNEE. 5. DO NOT REMOVE or get your bandages wet. This will increase your chances of getting an infection. 6. Do not remove or walk on the splint 7. A limited amount of pain and swelling may occur. The skin may take on a bruised appearance. This is no cause for alarm. 8. Apply an ice pack behind the knee for 15 minutes every hour. Continue icing until seen in the office. DO NOT apply any form of heat to the area. 9. Have prescription(s) filled immediately and take as directed. 10. Drink lots of liquids, water, and juice. 11. CALL THE OFFICE IMMEDIATELY IF: a. Bleeding continues b. Pain increases and/or does not respond to medication c. Bandage or cast appears too tight d. Any liquids (water, coffee, etc.) have spilled on your bandages. e. Tripping, falling, or stubbing the surgical foot f. If your temperature rises above 101 g. If you have ANY questions at all 12. Please use the crutches, knee scooter, or walker you have prescribed, rented, or purchased. If you are non-weight bearing DO NOT put weight on the operated foot for _________ days. If you are weight-bearing, follow your physician's instructions. You are expected to be:  ? non-weight bearing 13. Special Instructions: _____________________________________________________________ _________________________________________________________________________________ _________________________________________________________________________________  14. Your next appointment is: 11/06/2022 1:15 PM   If you need to reach the nurse for any reason, please  call: [place]: [phone] [place]: [phone] [place]: [phone]  AMBULATORY SURGERY  DISCHARGE INSTRUCTIONS   The drugs that you were given will stay in your system until tomorrow so for the next 24 hours you should not:  Drive an automobile Make any legal decisions Drink any alcoholic beverage   You may resume regular meals tomorrow.  Today it is better to start with liquids and gradually work up to solid foods.  You may eat anything you prefer, but it is better to start with liquids, then soup and crackers, and gradually work up to solid foods.   Please notify your doctor immediately if you have any unusual bleeding, trouble breathing, redness and pain at the surgery site, drainage, fever, or pain not relieved by medication.    Your post-operative visit with Dr.                                       is: Date:                        Time:    Please call to schedule your post-operative visit.  Additional Instructions: PLEASE LEAVE GREEN/TEAL BRACELET ON FOR 4 DAYS
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
## 2022-10-30 NOTE — Anesthesia Procedure Notes (Signed)
Anesthesia Regional Block: Popliteal block   Pre-Anesthetic Checklist: , timeout performed,  Correct Patient, Correct Site, Correct Laterality,  Correct Procedure, Correct Position, site marked,  Risks and benefits discussed,  Surgical consent,  Pre-op evaluation,  At surgeon's request and post-op pain management  Laterality: Lower and Left  Prep: chloraprep       Needles:  Injection technique: Single-shot  Needle Type: Echogenic Needle     Needle Length: 9cm  Needle Gauge: 21     Additional Needles:   Procedures:,,,, ultrasound used (permanent image in chart),,    Narrative:  Start time: 10/30/2022 11:25 AM End time: 10/30/2022 11:27 AM Injection made incrementally with aspirations every 5 mL.  Performed by: Personally  Anesthesiologist: Lenard Simmer, MD  Additional Notes: Patient consented for risk and benefits of nerve block including but not limited to nerve damage, failed block, bleeding and infection.  Patient voiced understanding.  Functioning IV was confirmed and monitors were applied.  Timeout done prior to procedure and prior to any sedation being given to the patient.  Patient confirmed procedure site prior to any sedation given to the patient.  A 50mm 22ga Stimuplex needle was used. Sterile prep,hand hygiene and sterile gloves were used.  Minimal sedation used for procedure.  No paresthesia endorsed by patient during the procedure.  Negative aspiration and negative test dose prior to incremental administration of local anesthetic. The patient tolerated the procedure well with no immediate complications.

## 2022-10-30 NOTE — Transfer of Care (Signed)
Immediate Anesthesia Transfer of Care Note  Patient: Kyle Lucero  Procedure(s) Performed: OPEN REDUCTION INTERNAL FIXATION (ORIF) METATARSAL (TOE) FRACTURE (Left: Toe) ARTHRODESIS FOOT (Left: Foot) OPEN REDUCTION INTERNAL FIXATION (ORIF) ANKLE FRACTURE (Left: Ankle)  Patient Location: PACU  Anesthesia Type:General  Level of Consciousness: drowsy  Airway & Oxygen Therapy: Patient Spontanous Breathing and Patient connected to face mask oxygen  Post-op Assessment: Report given to RN and Post -op Vital signs reviewed and stable  Post vital signs: Reviewed and stable  Last Vitals:  Vitals Value Taken Time  BP 128/69 10/30/22 1622  Temp 37.4 C 10/30/22 1622  Pulse 93 10/30/22 1626  Resp 17 10/30/22 1626  SpO2 100 % 10/30/22 1626  Vitals shown include unvalidated device data.  Last Pain:  Vitals:   10/30/22 1622  TempSrc:   PainSc: Asleep         Complications: No notable events documented.

## 2022-10-31 ENCOUNTER — Other Ambulatory Visit: Payer: Self-pay | Admitting: Podiatry

## 2022-10-31 DIAGNOSIS — S8252XA Displaced fracture of medial malleolus of left tibia, initial encounter for closed fracture: Secondary | ICD-10-CM

## 2022-10-31 DIAGNOSIS — S92332A Displaced fracture of third metatarsal bone, left foot, initial encounter for closed fracture: Secondary | ICD-10-CM

## 2022-10-31 DIAGNOSIS — S92342A Displaced fracture of fourth metatarsal bone, left foot, initial encounter for closed fracture: Secondary | ICD-10-CM

## 2022-10-31 DIAGNOSIS — S92322A Displaced fracture of second metatarsal bone, left foot, initial encounter for closed fracture: Secondary | ICD-10-CM

## 2022-10-31 MED ORDER — OXYCODONE HCL 5 MG PO TABS: 5.0000 mg | ORAL_TABLET | ORAL | 0 refills | Status: AC | PRN

## 2022-10-31 NOTE — Progress Notes (Signed)
PRN postop 
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
## 2022-11-03 ENCOUNTER — Encounter: Payer: Self-pay | Admitting: Podiatry

## 2022-11-06 ENCOUNTER — Ambulatory Visit (INDEPENDENT_AMBULATORY_CARE_PROVIDER_SITE_OTHER): Payer: Medicaid Other

## 2022-11-06 ENCOUNTER — Ambulatory Visit (INDEPENDENT_AMBULATORY_CARE_PROVIDER_SITE_OTHER): Payer: Medicaid Other | Admitting: Podiatry

## 2022-11-06 DIAGNOSIS — S92312A Displaced fracture of first metatarsal bone, left foot, initial encounter for closed fracture: Secondary | ICD-10-CM

## 2022-11-06 DIAGNOSIS — S8252XC Displaced fracture of medial malleolus of left tibia, initial encounter for open fracture type IIIA, IIIB, or IIIC: Secondary | ICD-10-CM

## 2022-11-06 DIAGNOSIS — S92302A Fracture of unspecified metatarsal bone(s), left foot, initial encounter for closed fracture: Secondary | ICD-10-CM | POA: Diagnosis not present

## 2022-11-06 DIAGNOSIS — S82892A Other fracture of left lower leg, initial encounter for closed fracture: Secondary | ICD-10-CM

## 2022-11-06 DIAGNOSIS — S92002A Unspecified fracture of left calcaneus, initial encounter for closed fracture: Secondary | ICD-10-CM

## 2022-11-06 DIAGNOSIS — M79672 Pain in left foot: Secondary | ICD-10-CM

## 2022-11-06 DIAGNOSIS — S8252XD Displaced fracture of medial malleolus of left tibia, subsequent encounter for closed fracture with routine healing: Secondary | ICD-10-CM

## 2022-11-06 DIAGNOSIS — Z9889 Other specified postprocedural states: Secondary | ICD-10-CM

## 2022-11-06 MED ORDER — OXYCODONE-ACETAMINOPHEN 5-325 MG PO TABS: 1.0000 | ORAL_TABLET | ORAL | 0 refills | Status: AC | PRN

## 2022-11-06 MED ORDER — DOXYCYCLINE HYCLATE 100 MG PO TABS: 100.0000 mg | ORAL_TABLET | Freq: Two times a day (BID) | ORAL | 0 refills | Status: AC
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
## 2022-11-11 ENCOUNTER — Encounter: Payer: Self-pay | Admitting: Podiatry

## 2022-11-18 ENCOUNTER — Ambulatory Visit (INDEPENDENT_AMBULATORY_CARE_PROVIDER_SITE_OTHER): Payer: Medicaid Other | Admitting: Podiatry

## 2022-11-18 ENCOUNTER — Ambulatory Visit (INDEPENDENT_AMBULATORY_CARE_PROVIDER_SITE_OTHER): Payer: Medicaid Other

## 2022-11-18 ENCOUNTER — Encounter: Payer: Self-pay | Admitting: Podiatry

## 2022-11-18 DIAGNOSIS — S92322A Displaced fracture of second metatarsal bone, left foot, initial encounter for closed fracture: Secondary | ICD-10-CM

## 2022-11-18 DIAGNOSIS — S92302D Fracture of unspecified metatarsal bone(s), left foot, subsequent encounter for fracture with routine healing: Secondary | ICD-10-CM

## 2022-11-18 DIAGNOSIS — S82892D Other fracture of left lower leg, subsequent encounter for closed fracture with routine healing: Secondary | ICD-10-CM

## 2022-11-18 DIAGNOSIS — S92302A Fracture of unspecified metatarsal bone(s), left foot, initial encounter for closed fracture: Secondary | ICD-10-CM

## 2022-11-19 NOTE — Progress Notes (Signed)
  Subjective:  Patient ID: Kyle Lucero, male    DOB: [birth year],  MRN: [id number]  Chief Complaint  Patient presents with   Routine Post Op    POV # 2 DOS 10/30/22 --- OPEN REPAIR OF LEFT ANKLE FRACTURE, METATARSAL FRACTURES, JOINT FUSIONS OF MIDFOOT     32 y.o. male returns for post-op check.  He says he is doing well he feels swollen but the pain is getting better has been using his crutches and staying off  Review of Systems: Negative except as noted in the HPI. Denies N/V/F/Ch.   Objective:  There were no vitals filed for this visit. There is no height or weight on file to calculate BMI. Constitutional Well developed. Well nourished.  Vascular Foot warm and well perfused. Capillary refill normal to all digits.  Calf is soft and supple, no posterior calf or knee pain, negative Homans' sign  Neurologic Normal speech. Oriented to person, place, and time. Epicritic sensation to light touch grossly present bilaterally.  Dermatologic Skin healing well without signs of infection. Skin edges well coapted without signs of infection.  Orthopedic: Tenderness to palpation noted about the surgical site.  Moderate edema   Multiple view plain film radiographs: New foot radiographs today show good positioning of hardware and implants with consolidation beginning across fracture sites, prior ankle radiographs reviewed from last visit and good position of hardware and reduction of ankle mortise Assessment:   1. Closed nondisplaced fracture of metatarsal bone of left foot with routine healing, unspecified metatarsal, subsequent encounter   2. Closed fracture of left ankle with routine healing, subsequent encounter    Plan:  Patient was evaluated and treated and all questions answered.  S/p foot surgery left -Progressing as expected post-operatively. -XR: Noted above no complications -WB Status: NWB in cam walker boot with crutches -Sutures: All sutures removed today. -Medications: No  refills required -Compression sleeve applied.  May begin bathing and showering regularly  Return in about 3 weeks (around 12/09/2022) for post op (new x-rays).

## 2022-12-09 ENCOUNTER — Ambulatory Visit (INDEPENDENT_AMBULATORY_CARE_PROVIDER_SITE_OTHER): Payer: Medicaid Other

## 2022-12-09 ENCOUNTER — Ambulatory Visit (INDEPENDENT_AMBULATORY_CARE_PROVIDER_SITE_OTHER): Payer: Medicaid Other | Admitting: Podiatry

## 2022-12-09 DIAGNOSIS — S92302D Fracture of unspecified metatarsal bone(s), left foot, subsequent encounter for fracture with routine healing: Secondary | ICD-10-CM

## 2022-12-09 DIAGNOSIS — S82892D Other fracture of left lower leg, subsequent encounter for closed fracture with routine healing: Secondary | ICD-10-CM

## 2022-12-09 NOTE — Patient Instructions (Signed)
Starting on June 17th you may begin wearing a normal shoe again, use a stiff soled shoe like a running shoe or a work/hiking boot

## 2022-12-11 ENCOUNTER — Encounter: Payer: Self-pay | Admitting: Podiatry

## 2022-12-11 NOTE — Progress Notes (Signed)
  Subjective:  Patient ID: Kyle Lucero, male    DOB: 09/10/1990,  MRN: [id number]  Chief Complaint  Patient presents with   Routine Post Op    POV # 3 DOS 10/30/22 --- OPEN REPAIR OF LEFT ANKLE FRACTURE, METATARSAL FRACTURES, JOINT FUSIONS OF MIDFOOT     32 y.o. male returns for post-op check.  He is doing well not having any pain  Review of Systems: Negative except as noted in the HPI. Denies N/V/F/Ch.   Objective:  There were no vitals filed for this visit. There is no height or weight on file to calculate BMI. Constitutional Well developed. Well nourished.  Vascular Foot warm and well perfused. Capillary refill normal to all digits.  Calf is soft and supple, no posterior calf or knee pain, negative Homans' sign  Neurologic Normal speech. Oriented to person, place, and time. Epicritic sensation to light touch grossly present bilaterally.  Dermatologic Incisions are well-healed on hypertrophic  Orthopedic: Still has mild to moderate edema.  No pain to palpation   Multiple view plain film radiographs: New films taken today of the foot and ankle show good early consolidation across fusion and fracture sites no complication of hardware all hardware intact and in good position. Assessment:   1. Closed fracture of left ankle with routine healing, subsequent encounter   2. Closed nondisplaced fracture of metatarsal bone of left foot with routine healing, unspecified metatarsal, subsequent encounter    Plan:  Patient was evaluated and treated and all questions answered.  S/p foot surgery left -Overall doing well, he needs to wear the cam boot for at least 2 more weeks and then can gradually transition back to a supportive shoe with the ankle sleeve, I recommended a hightop work boot or hiking boot.  If still having pain continue using cam boot.  I will see him back in 6 weeks for final radiographs  Return in about 6 weeks (around 01/20/2023) for post op (new x-rays).

## 2023-01-19 ENCOUNTER — Ambulatory Visit (INDEPENDENT_AMBULATORY_CARE_PROVIDER_SITE_OTHER): Payer: MEDICAID

## 2023-01-19 ENCOUNTER — Ambulatory Visit (INDEPENDENT_AMBULATORY_CARE_PROVIDER_SITE_OTHER): Payer: MEDICAID | Admitting: Podiatry

## 2023-01-19 DIAGNOSIS — S82892D Other fracture of left lower leg, subsequent encounter for closed fracture with routine healing: Secondary | ICD-10-CM | POA: Diagnosis not present

## 2023-01-19 DIAGNOSIS — S82892A Other fracture of left lower leg, initial encounter for closed fracture: Secondary | ICD-10-CM

## 2023-01-19 DIAGNOSIS — S92302D Fracture of unspecified metatarsal bone(s), left foot, subsequent encounter for fracture with routine healing: Secondary | ICD-10-CM

## 2023-01-19 DIAGNOSIS — S92002A Unspecified fracture of left calcaneus, initial encounter for closed fracture: Secondary | ICD-10-CM

## 2023-01-19 NOTE — Progress Notes (Signed)
  Subjective:  Patient ID: Kyle Lucero, male    DOB: [birth year],  MRN: [id number]  Chief Complaint  Patient presents with   Routine Post Op     32 y.o. male returns for post-op check.  He is doing well not having any pain  Review of Systems: Negative except as noted in the HPI. Denies N/V/F/Ch.   Objective:  There were no vitals filed for this visit. There is no height or weight on file to calculate BMI. Constitutional Well developed. Well nourished.  Vascular Foot warm and well perfused. Capillary refill normal to all digits.  Calf is soft and supple, no posterior calf or knee pain, negative Homans' sign  Neurologic Normal speech. Oriented to person, place, and time. Epicritic sensation to light touch grossly present bilaterally.  Dermatologic Incisions are well-healed on hypertrophic  Orthopedic: Still has mild to moderate edema.  No pain to palpation   Multiple view plain film radiographs: New films taken today of the foot and ankle show good  consolidation across fusion and fracture sites no complication of hardware all hardware intact and in good position. Assessment:   1. Closed fracture of left ankle with routine healing, subsequent encounter    Plan:  Patient was evaluated and treated and all questions answered.  S/p foot surgery left -Clinically doing much better radiographs show consolidation.  Patient has returned to regular activities without restriction.  If any foot and ankle issues arise in the future he will come see Dr. Abbott Pao.  s  No follow-ups on file.
